# Patient Record
Sex: Female | Born: 1945 | Race: Black or African American | Hispanic: No | Marital: Married | Smoking: Never smoker
Health system: Southern US, Community
[De-identification: ages and names within clinical notes are randomized; demographics above are authoritative.]

## PROBLEM LIST (undated history)

## (undated) DIAGNOSIS — I10 Essential (primary) hypertension: Secondary | ICD-10-CM

## (undated) HISTORY — PX: NECK SURGERY: SHX720

## (undated) HISTORY — PX: TUBAL LIGATION: SHX77

## (undated) HISTORY — PX: CHOLECYSTECTOMY: SHX55

## (undated) HISTORY — PX: ABDOMINAL HYSTERECTOMY: SHX81

---

## 2006-08-05 ENCOUNTER — Emergency Department (HOSPITAL_COMMUNITY): Admission: EM | Admit: 2006-08-05 | Discharge: 2006-08-05 | Payer: Self-pay | Admitting: Emergency Medicine

## 2010-02-08 ENCOUNTER — Emergency Department (HOSPITAL_COMMUNITY): Admission: EM | Admit: 2010-02-08 | Discharge: 2010-02-08 | Payer: Self-pay | Admitting: Emergency Medicine

## 2010-02-28 ENCOUNTER — Emergency Department (HOSPITAL_COMMUNITY): Admission: EM | Admit: 2010-02-28 | Discharge: 2010-02-28 | Payer: Self-pay | Admitting: Emergency Medicine

## 2010-09-08 LAB — COMPREHENSIVE METABOLIC PANEL
ALT: 19 U/L (ref 0–35)
AST: 24 U/L (ref 0–37)
Chloride: 107 mEq/L (ref 96–112)
Sodium: 138 mEq/L (ref 135–145)
Total Protein: 7.8 g/dL (ref 6.0–8.3)

## 2010-09-08 LAB — URINALYSIS, ROUTINE W REFLEX MICROSCOPIC
Bilirubin Urine: NEGATIVE
Glucose, UA: NEGATIVE mg/dL
Hgb urine dipstick: NEGATIVE
Ketones, ur: NEGATIVE mg/dL
Nitrite: NEGATIVE
Specific Gravity, Urine: >1.03 — ABNORMAL HIGH (ref 1.005–1.030)
Urobilinogen, UA: 0.2 mg/dL (ref 0.0–1.0)

## 2010-09-08 LAB — CBC
Hemoglobin: 13.1 g/dL (ref 12.0–15.0)
MCHC: 32.4 g/dL (ref 30.0–36.0)
Platelets: 161 10*3/uL (ref 150–400)
RDW: 15.1 % (ref 11.5–15.5)
WBC: 7.1 10*3/uL (ref 4.0–10.5)

## 2010-09-08 LAB — DIFFERENTIAL
Basophils Absolute: 0 10*3/uL (ref 0.0–0.1)
Basophils Relative: 0 % (ref 0–1)
Eosinophils Relative: 0 % (ref 0–5)
Lymphocytes Relative: 20 % (ref 12–46)
Lymphs Abs: 1.4 10*3/uL (ref 0.7–4.0)
Monocytes Absolute: 0.1 10*3/uL (ref 0.1–1.0)

## 2010-09-09 LAB — CBC
HCT: 41.7 % (ref 36.0–46.0)
MCH: 27.1 pg (ref 26.0–34.0)
RBC: 4.97 MIL/uL (ref 3.87–5.11)
WBC: 8.1 10*3/uL (ref 4.0–10.5)

## 2010-09-09 LAB — HEPATIC FUNCTION PANEL
ALT: 28 U/L (ref 0–35)
AST: 26 U/L (ref 0–37)
Albumin: 4.2 g/dL (ref 3.5–5.2)
Alkaline Phosphatase: 75 U/L (ref 39–117)
Bilirubin, Direct: 0.1 mg/dL (ref 0.0–0.3)
Total Bilirubin: 0.5 mg/dL (ref 0.3–1.2)

## 2010-09-09 LAB — DIFFERENTIAL
Basophils Absolute: 0 10*3/uL (ref 0.0–0.1)
Basophils Relative: 0 % (ref 0–1)
Lymphocytes Relative: 11 % — ABNORMAL LOW (ref 12–46)
Lymphs Abs: 0.9 10*3/uL (ref 0.7–4.0)
Neutro Abs: 7.1 10*3/uL (ref 1.7–7.7)

## 2010-09-09 LAB — BASIC METABOLIC PANEL
BUN: 11 mg/dL (ref 6–23)
Chloride: 103 mEq/L (ref 96–112)
Glucose, Bld: 142 mg/dL — ABNORMAL HIGH (ref 70–99)
Potassium: 4.1 mEq/L (ref 3.5–5.1)
Sodium: 139 mEq/L (ref 135–145)

## 2011-11-17 ENCOUNTER — Other Ambulatory Visit (HOSPITAL_COMMUNITY): Payer: Self-pay | Admitting: Oncology

## 2012-01-15 ENCOUNTER — Emergency Department (HOSPITAL_COMMUNITY): Payer: No Typology Code available for payment source

## 2012-01-15 ENCOUNTER — Encounter (HOSPITAL_COMMUNITY): Payer: Self-pay | Admitting: Emergency Medicine

## 2012-01-15 ENCOUNTER — Emergency Department (HOSPITAL_COMMUNITY)
Admission: EM | Admit: 2012-01-15 | Discharge: 2012-01-15 | Disposition: A | Discharge status: Home / Self Care | Payer: No Typology Code available for payment source | Attending: Emergency Medicine | Admitting: Emergency Medicine

## 2012-01-15 DIAGNOSIS — M549 Dorsalgia, unspecified: Secondary | ICD-10-CM | POA: Insufficient documentation

## 2012-01-15 DIAGNOSIS — R079 Chest pain, unspecified: Secondary | ICD-10-CM | POA: Insufficient documentation

## 2012-01-15 DIAGNOSIS — S20219A Contusion of unspecified front wall of thorax, initial encounter: Secondary | ICD-10-CM | POA: Insufficient documentation

## 2012-01-15 DIAGNOSIS — R071 Chest pain on breathing: Secondary | ICD-10-CM | POA: Insufficient documentation

## 2012-01-15 LAB — COMPREHENSIVE METABOLIC PANEL
ALT: 14 U/L (ref 0–35)
AST: 15 U/L (ref 0–37)
Chloride: 104 mEq/L (ref 96–112)
Creatinine, Ser: 0.96 mg/dL (ref 0.50–1.10)
GFR calc non Af Amer: 60 mL/min — ABNORMAL LOW (ref >90)
Glucose, Bld: 100 mg/dL — ABNORMAL HIGH (ref 70–99)
Total Bilirubin: 0.3 mg/dL (ref 0.3–1.2)

## 2012-01-15 LAB — CBC WITH DIFFERENTIAL/PLATELET
Basophils Absolute: 0 10*3/uL (ref 0.0–0.1)
Eosinophils Absolute: 0.1 10*3/uL (ref 0.0–0.7)
HCT: 39.9 % (ref 36.0–46.0)
Lymphocytes Relative: 47 % — ABNORMAL HIGH (ref 12–46)
Lymphs Abs: 2.4 10*3/uL (ref 0.7–4.0)
Monocytes Relative: 6 % (ref 3–12)
Neutro Abs: 2.3 10*3/uL (ref 1.7–7.7)
RBC: 4.83 MIL/uL (ref 3.87–5.11)
RDW: 15.2 % (ref 11.5–15.5)
WBC: 5.1 10*3/uL (ref 4.0–10.5)

## 2012-01-15 MED ORDER — HYDROCODONE-ACETAMINOPHEN 5-325 MG PO TABS: 1.0000 | ORAL_TABLET | Freq: Four times a day (QID) | ORAL | Status: AC | PRN

## 2012-01-15 MED ORDER — IOHEXOL 350 MG/ML SOLN
100.0000 mL | Freq: Once | INTRAVENOUS | Status: AC | PRN
  Administered 2012-01-15: 100 mL via INTRAVENOUS

## 2012-01-15 NOTE — ED Notes (Signed)
Patient states she does not need anything at this time. 
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
## 2014-08-20 ENCOUNTER — Emergency Department (HOSPITAL_COMMUNITY): Payer: No Typology Code available for payment source

## 2014-08-20 ENCOUNTER — Emergency Department (HOSPITAL_COMMUNITY)
Admission: EM | Admit: 2014-08-20 | Discharge: 2014-08-20 | Disposition: A | Discharge status: Home / Self Care | Payer: No Typology Code available for payment source | Attending: Emergency Medicine | Admitting: Emergency Medicine

## 2014-08-20 ENCOUNTER — Encounter (HOSPITAL_COMMUNITY): Payer: Self-pay | Admitting: Emergency Medicine

## 2014-08-20 DIAGNOSIS — Z79899 Other long term (current) drug therapy: Secondary | ICD-10-CM | POA: Insufficient documentation

## 2014-08-20 DIAGNOSIS — S79911A Unspecified injury of right hip, initial encounter: Secondary | ICD-10-CM | POA: Insufficient documentation

## 2014-08-20 DIAGNOSIS — S79912A Unspecified injury of left hip, initial encounter: Secondary | ICD-10-CM | POA: Insufficient documentation

## 2014-08-20 DIAGNOSIS — Y9389 Activity, other specified: Secondary | ICD-10-CM | POA: Diagnosis not present

## 2014-08-20 DIAGNOSIS — S4992XA Unspecified injury of left shoulder and upper arm, initial encounter: Secondary | ICD-10-CM | POA: Insufficient documentation

## 2014-08-20 DIAGNOSIS — Y998 Other external cause status: Secondary | ICD-10-CM | POA: Diagnosis not present

## 2014-08-20 DIAGNOSIS — S299XXA Unspecified injury of thorax, initial encounter: Secondary | ICD-10-CM | POA: Diagnosis not present

## 2014-08-20 DIAGNOSIS — Z880 Allergy status to penicillin: Secondary | ICD-10-CM | POA: Insufficient documentation

## 2014-08-20 DIAGNOSIS — S199XXA Unspecified injury of neck, initial encounter: Secondary | ICD-10-CM | POA: Insufficient documentation

## 2014-08-20 DIAGNOSIS — Y92410 Unspecified street and highway as the place of occurrence of the external cause: Secondary | ICD-10-CM | POA: Insufficient documentation

## 2014-08-20 LAB — BASIC METABOLIC PANEL
ANION GAP: 3 — AB (ref 5–15)
BUN: 13 mg/dL (ref 6–23)
CALCIUM: 9.6 mg/dL (ref 8.4–10.5)
CO2: 29 mmol/L (ref 19–32)
CREATININE: 0.69 mg/dL (ref 0.50–1.10)
Chloride: 108 mmol/L (ref 96–112)
GFR calc non Af Amer: 87 mL/min — ABNORMAL LOW (ref >90)
Glucose, Bld: 91 mg/dL (ref 70–99)
Potassium: 4.5 mmol/L (ref 3.5–5.1)
Sodium: 140 mmol/L (ref 135–145)

## 2014-08-20 LAB — CBC WITH DIFFERENTIAL/PLATELET
BASOS PCT: 0 % (ref 0–1)
Basophils Absolute: 0 10*3/uL (ref 0.0–0.1)
EOS ABS: 0.1 10*3/uL (ref 0.0–0.7)
Eosinophils Relative: 1 % (ref 0–5)
HCT: 41.9 % (ref 36.0–46.0)
Hemoglobin: 13.6 g/dL (ref 12.0–15.0)
Lymphocytes Relative: 41 % (ref 12–46)
Lymphs Abs: 2.1 10*3/uL (ref 0.7–4.0)
MCH: 27.5 pg (ref 26.0–34.0)
MCHC: 32.5 g/dL (ref 30.0–36.0)
MCV: 84.6 fL (ref 78.0–100.0)
MONOS PCT: 8 % (ref 3–12)
Monocytes Absolute: 0.4 10*3/uL (ref 0.1–1.0)
NEUTROS PCT: 50 % (ref 43–77)
Neutro Abs: 2.6 10*3/uL (ref 1.7–7.7)
Platelets: 177 10*3/uL (ref 150–400)
RBC: 4.95 MIL/uL (ref 3.87–5.11)
RDW: 15 % (ref 11.5–15.5)
WBC: 5.2 10*3/uL (ref 4.0–10.5)

## 2014-08-20 MED ORDER — SODIUM CHLORIDE 0.9 % IV BOLUS (SEPSIS)
1000.0000 mL | Freq: Once | INTRAVENOUS | Status: AC
  Administered 2014-08-20: 1000 mL via INTRAVENOUS

## 2014-08-20 MED ORDER — IOHEXOL 350 MG/ML SOLN
80.0000 mL | Freq: Once | INTRAVENOUS | Status: AC | PRN
  Administered 2014-08-20: 80 mL via INTRAVENOUS
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
## 2014-08-20 NOTE — ED Notes (Signed)
MD at bedside. 
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
## 2016-09-07 ENCOUNTER — Observation Stay (HOSPITAL_COMMUNITY)
Admission: EM | Admit: 2016-09-07 | Discharge: 2016-09-08 | Disposition: A | Discharge status: Home / Self Care | Payer: Medicare Other | Attending: Family Medicine | Admitting: Family Medicine

## 2016-09-07 ENCOUNTER — Encounter (HOSPITAL_COMMUNITY): Payer: Self-pay | Admitting: *Deleted

## 2016-09-07 ENCOUNTER — Emergency Department (HOSPITAL_COMMUNITY): Payer: Medicare Other

## 2016-09-07 DIAGNOSIS — K047 Periapical abscess without sinus: Secondary | ICD-10-CM | POA: Diagnosis present

## 2016-09-07 DIAGNOSIS — I10 Essential (primary) hypertension: Secondary | ICD-10-CM | POA: Diagnosis not present

## 2016-09-07 DIAGNOSIS — K0889 Other specified disorders of teeth and supporting structures: Secondary | ICD-10-CM | POA: Diagnosis not present

## 2016-09-07 DIAGNOSIS — L03211 Cellulitis of face: Secondary | ICD-10-CM | POA: Diagnosis not present

## 2016-09-07 DIAGNOSIS — R220 Localized swelling, mass and lump, head: Secondary | ICD-10-CM | POA: Diagnosis not present

## 2016-09-07 DIAGNOSIS — Z79899 Other long term (current) drug therapy: Secondary | ICD-10-CM | POA: Diagnosis not present

## 2016-09-07 LAB — CBC WITH DIFFERENTIAL/PLATELET
BASOS PCT: 0 %
Basophils Absolute: 0 10*3/uL (ref 0.0–0.1)
EOS ABS: 0.1 10*3/uL (ref 0.0–0.7)
Eosinophils Relative: 1 %
HEMATOCRIT: 39.4 % (ref 36.0–46.0)
HEMOGLOBIN: 12.9 g/dL (ref 12.0–15.0)
LYMPHS ABS: 1.9 10*3/uL (ref 0.7–4.0)
Lymphocytes Relative: 27 %
MCH: 27.8 pg (ref 26.0–34.0)
MCHC: 32.7 g/dL (ref 30.0–36.0)
MCV: 84.9 fL (ref 78.0–100.0)
Monocytes Absolute: 0.5 10*3/uL (ref 0.1–1.0)
Monocytes Relative: 8 %
NEUTROS PCT: 64 %
Neutro Abs: 4.4 10*3/uL (ref 1.7–7.7)
Platelets: 190 10*3/uL (ref 150–400)
RBC: 4.64 MIL/uL (ref 3.87–5.11)
RDW: 14.6 % (ref 11.5–15.5)
WBC: 6.9 10*3/uL (ref 4.0–10.5)

## 2016-09-07 LAB — BASIC METABOLIC PANEL
Anion gap: 7 (ref 5–15)
BUN: 7 mg/dL (ref 6–20)
CHLORIDE: 104 mmol/L (ref 101–111)
CO2: 30 mmol/L (ref 22–32)
Calcium: 9.3 mg/dL (ref 8.9–10.3)
Creatinine, Ser: 0.72 mg/dL (ref 0.44–1.00)
GFR calc non Af Amer: >60 mL/min (ref >60)
Glucose, Bld: 93 mg/dL (ref 65–99)
POTASSIUM: 4.4 mmol/L (ref 3.5–5.1)
SODIUM: 141 mmol/L (ref 135–145)

## 2016-09-07 LAB — LACTIC ACID, PLASMA: LACTIC ACID, VENOUS: 1 mmol/L (ref 0.5–1.9)

## 2016-09-07 MED ORDER — HYDROCODONE-ACETAMINOPHEN 5-325 MG PO TABS
1.0000 | ORAL_TABLET | Freq: Four times a day (QID) | ORAL | Status: DC | PRN
  Administered 2016-09-07: 1 via ORAL
  Filled 2016-09-07: qty 1

## 2016-09-07 MED ORDER — ONDANSETRON HCL 4 MG PO TABS: 4.0000 mg | ORAL_TABLET | Freq: Four times a day (QID) | ORAL | Status: DC | PRN

## 2016-09-07 MED ORDER — SODIUM CHLORIDE 0.9% FLUSH
3.0000 mL | Freq: Two times a day (BID) | INTRAVENOUS | Status: DC
  Administered 2016-09-07: 3 mL via INTRAVENOUS

## 2016-09-07 MED ORDER — OXYCODONE-ACETAMINOPHEN 5-325 MG PO TABS
1.0000 | ORAL_TABLET | Freq: Once | ORAL | Status: AC
  Administered 2016-09-07: 1 via ORAL
  Filled 2016-09-07: qty 1

## 2016-09-07 MED ORDER — HYDRALAZINE HCL 20 MG/ML IJ SOLN: 10.0000 mg | INTRAMUSCULAR | Status: DC | PRN

## 2016-09-07 MED ORDER — ACETAMINOPHEN 650 MG RE SUPP: 650.0000 mg | Freq: Four times a day (QID) | RECTAL | Status: DC | PRN

## 2016-09-07 MED ORDER — SODIUM CHLORIDE 0.9% FLUSH: 3.0000 mL | INTRAVENOUS | Status: DC | PRN

## 2016-09-07 MED ORDER — ONDANSETRON HCL 4 MG/2ML IJ SOLN
4.0000 mg | Freq: Once | INTRAMUSCULAR | Status: AC
  Administered 2016-09-07: 4 mg via INTRAVENOUS
  Filled 2016-09-07: qty 2

## 2016-09-07 MED ORDER — ACETAMINOPHEN 325 MG PO TABS
650.0000 mg | ORAL_TABLET | Freq: Four times a day (QID) | ORAL | Status: DC | PRN
  Administered 2016-09-08: 650 mg via ORAL
  Filled 2016-09-07: qty 2

## 2016-09-07 MED ORDER — KETOROLAC TROMETHAMINE 15 MG/ML IJ SOLN: 15.0000 mg | Freq: Four times a day (QID) | INTRAMUSCULAR | Status: DC | PRN

## 2016-09-07 MED ORDER — HEPARIN SODIUM (PORCINE) 5000 UNIT/ML IJ SOLN
5000.0000 [IU] | Freq: Three times a day (TID) | INTRAMUSCULAR | Status: DC
  Filled 2016-09-07: qty 1

## 2016-09-07 MED ORDER — DOXYCYCLINE HYCLATE 100 MG IV SOLR
100.0000 mg | Freq: Two times a day (BID) | INTRAVENOUS | Status: DC
  Administered 2016-09-07 – 2016-09-08 (×3): 100 mg via INTRAVENOUS
  Filled 2016-09-07 (×9): qty 100

## 2016-09-07 MED ORDER — CLINDAMYCIN PHOSPHATE 600 MG/50ML IV SOLN
600.0000 mg | Freq: Once | INTRAVENOUS | Status: AC
  Administered 2016-09-07: 600 mg via INTRAVENOUS
  Filled 2016-09-07: qty 50

## 2016-09-07 MED ORDER — SODIUM CHLORIDE 0.9 % IV SOLN: 250.0000 mL | INTRAVENOUS | Status: DC | PRN

## 2016-09-07 MED ORDER — SODIUM CHLORIDE 0.9 % IV SOLN
INTRAVENOUS | Status: AC
  Administered 2016-09-07: 15:00:00 via INTRAVENOUS

## 2016-09-07 MED ORDER — SENNOSIDES-DOCUSATE SODIUM 8.6-50 MG PO TABS: 1.0000 | ORAL_TABLET | Freq: Every evening | ORAL | Status: DC | PRN

## 2016-09-07 MED ORDER — ONDANSETRON HCL 4 MG/2ML IJ SOLN: 4.0000 mg | Freq: Four times a day (QID) | INTRAMUSCULAR | Status: DC | PRN

## 2016-09-07 MED ORDER — AMLODIPINE BESYLATE 5 MG PO TABS
5.0000 mg | ORAL_TABLET | Freq: Every day | ORAL | Status: DC
  Administered 2016-09-08: 5 mg via ORAL
  Filled 2016-09-07: qty 1

## 2016-09-07 MED ORDER — SACCHAROMYCES BOULARDII 250 MG PO CAPS
250.0000 mg | ORAL_CAPSULE | Freq: Two times a day (BID) | ORAL | Status: DC
  Administered 2016-09-07 – 2016-09-08 (×3): 250 mg via ORAL
  Filled 2016-09-07 (×3): qty 1

## 2016-09-07 MED ORDER — IOPAMIDOL (ISOVUE-300) INJECTION 61%
75.0000 mL | Freq: Once | INTRAVENOUS | Status: AC | PRN
  Administered 2016-09-07: 75 mL via INTRAVENOUS

## 2016-09-07 MED ORDER — TRAZODONE HCL 50 MG PO TABS: 25.0000 mg | ORAL_TABLET | Freq: Every evening | ORAL | Status: DC | PRN

## 2016-09-07 NOTE — ED Provider Notes (Signed)
[facility] DEPT Provider Note   CSN: [id number] Arrival date & time: 09/07/16  0941     History   Chief Complaint Chief Complaint  Patient presents with  . Facial Swelling    left jaw    HPI Teresa Gillespie is a 71 y.o. female.  Patient is a 70 year old female who presents to the emergency department with a complaint of left facial swelling.  Patient reports that for 2-3 weeks she has been having problems with her teeth. She's been seen by a dentist. She was placed on oral clindamycin. She recently finished her first week of this medication. She noted continued swelling of her face, and went back to see the dentist. The dentist again place the patient on a second round of clindamycin. This morning the patient noted swelling not only in the right jaw, but the right chin area and lip. Underneath her chin on, as well as the previous area that was on the left outer jaw area. The patient states she has not had recent high fever. She has not done nauseated or vomiting. She has discomfort when she attempts to eat. She presents to the emergency department because she was concerned about some much swelling about her face and under her chin area.   The history is provided by the patient.    History reviewed. No pertinent past medical history.  There are no active problems to display for this patient.   Past Surgical History:  Procedure Laterality Date  . ABDOMINAL HYSTERECTOMY    . CHOLECYSTECTOMY    . NECK SURGERY    . TUBAL LIGATION      OB History    No data available       Home Medications    Prior to Admission medications   Medication Sig Start Date End Date Taking? Authorizing Provider  cimetidine (TAGAMET) 200 MG tablet Take 200 mg by mouth daily as needed (heartburn).   Yes Historical Provider, MD  clindamycin (CLEOCIN) 300 MG capsule Take 300 mg by mouth 4 (four) times daily.   Yes Historical Provider, MD    Family History No family history on  file.  Social History Social History  Substance Use Topics  . Smoking status: Never Smoker  . Smokeless tobacco: Never Used  . Alcohol use No     Allergies   Penicillins   Review of Systems Review of Systems  HENT: Positive for dental problem and facial swelling. Negative for drooling, trouble swallowing and voice change.   All other systems reviewed and are negative.    Physical Exam Updated Vital Signs BP (!) 156/81   Pulse (!) 52   Temp 98 F (36.7 C) (Oral)   Resp 16   Ht 5' (1.524 m)   Wt 89.8 kg   SpO2 97%   BMI 38.67 kg/m   Physical Exam  Constitutional: She is oriented to person, place, and time. She appears well-developed and well-nourished.  Non-toxic appearance.  HENT:  Head: Normocephalic.  Right Ear: Tympanic membrane and external ear normal.  Left Ear: Tympanic membrane and external ear normal.  There are dental caries noted of the incisors of the lower jaw area. There is some swelling of the gum in this area. Is no visible abscess appreciated. No swelling of the tongue. No swelling of the upper lip. There is swelling of the left face. There is swelling of the left lower lip extending into the chin. There is swelling in the left submental area. The swelling is warm,  but not hot. There no red streaks about the face.  Eyes: EOM and lids are normal. Pupils are equal, round, and reactive to light.  Neck: Normal range of motion. Neck supple. Carotid bruit is not present.  Cardiovascular: Normal rate, regular rhythm, normal heart sounds, intact distal pulses and normal pulses.  Exam reveals no gallop and no friction rub.   Pulmonary/Chest: Breath sounds normal. No stridor. No respiratory distress. She has no wheezes.  Patient speaks in complete sentences without problem. There is symmetrical rise and fall of the chest. There's no laboring of respirations.  Abdominal: Soft. Bowel sounds are normal. There is no tenderness. There is no guarding.  Musculoskeletal:  Normal range of motion.  Lymphadenopathy:       Head (right side): No submandibular adenopathy present.       Head (left side): No submandibular adenopathy present.    She has no cervical adenopathy.  Neurological: She is alert and oriented to person, place, and time. She has normal strength. No cranial nerve deficit or sensory deficit.  Skin: Skin is warm and dry.  No hives, no rash appreciated.  Psychiatric: She has a normal mood and affect. Her speech is normal.  Nursing note and vitals reviewed.    ED Treatments / Results  Labs (all labs ordered are listed, but only abnormal results are displayed) Labs Reviewed  CBC WITH DIFFERENTIAL/PLATELET  BASIC METABOLIC PANEL    EKG  EKG Interpretation None       Radiology Ct Maxillofacial W Contrast  Result Date: 09/07/2016 CLINICAL DATA:  71 year old female with left facial swelling for 2-3 weeks. Recently completed course of antibiotics. Reported abscess tooth. EXAM: CT MAXILLOFACIAL WITH CONTRAST TECHNIQUE: Multidetector CT imaging of the maxillofacial structures was performed. Multiplanar CT image reconstructions were also generated. A small metallic BB was placed on the right temple in order to reliably differentiate right from left. CONTRAST:  75 cc intravenous Isovue-300 COMPARISON:  08/20/2014 neck CT FINDINGS: Osseous: A cavity and periapical abscess of tooth #20 is noted with left facial soft tissue swelling. A periapical abscess of tooth #12 is also noted. No fracture or subluxation identified. No suspicious focal bony lesion noted. Orbits: Negative. No traumatic or inflammatory finding. Sinuses: Clear. Soft tissues: Left facial soft tissue swelling noted without soft tissue abscess. Limited intracranial: No significant or unexpected finding. IMPRESSION: Periapical abscesses of teeth #20 and #12 with left facial soft tissue swelling. No evidence of soft tissue abscess. No other abnormalities identified. Electronically Signed   By:  Harmon Pier M.D.   On: 09/07/2016 13:09    Procedures Procedures (including critical care time)  Medications Ordered in ED Medications  iopamidol (ISOVUE-300) 61 % injection 75 mL (75 mLs Intravenous Contrast Given 09/07/16 1247)     Initial Impression / Assessment and Plan / ED Course  I have reviewed the triage vital signs and the nursing notes.  Pertinent labs & imaging results that were available during my care of the patient were reviewed by me and considered in my medical decision making (see chart for details).     *I have reviewed nursing notes, vital signs, and all appropriate lab and imaging results for this patient.**  Final Clinical Impressions(s) / ED Diagnoses MDM The blood pressure is slightly elevated, but otherwise vital signs within normal limits. The oxygen pulse ox is 97% on room air. The complete blood count is well within normal limits. The basic metabolic panel is within normal limits. CT scan of the maxillofacial structures  shows a left facial soft tissue swelling without a soft tissue abscess. This periapical abscess of tooth #20 and #12. Patient started on IV clindamycin. Findings discussed with pt and spouse.   Case discussed with Dr. Clarene DukeMcManus. Call placed to [facility].  Patient to be admitted to the hospital.    Final diagnoses:  None    New Prescriptions New Prescriptions   No medications on file     Ivery QualeHobson Sandar Krinke, PA-C 09/07/16 1551    Samuel JesterKathleen McManus, DO 09/10/16 [phone]

## 2016-09-07 NOTE — ED Notes (Signed)
Consulted PA concerning IV antibiotics administration. PA reported to administer abx post blood cultures being obtained.

## 2016-09-07 NOTE — ED Notes (Signed)
Pt back in room at this time.

## 2016-09-07 NOTE — ED Notes (Signed)
Phlebotomist obtained x1 blood culture. Dr. Laural BenesJohnson at bedside assessing patient and reported to phlebotomy that only one set of blood cultures was acceptable due to pt having history of being difficult stick. Phlebotomy reported able to obtain one blood culture and reports no other access found.

## 2016-09-07 NOTE — ED Notes (Signed)
Pt come out to nurses station and reported "the swelling is getting worse."  airway patent. PA aware.   Increased swelling noted to right side of neck.

## 2016-09-07 NOTE — ED Triage Notes (Signed)
Pt c/o left sided facial swelling that started last night. Pt reports when she woke up this morning the swelling had increased tremendously. Pt was taking Clindamycin for tooth cavity/abscess to the front lower side. She finished the antibiotic on Monday and saw her dentist yesterday and was put on a second round of Clindamycin that she started last night. Denies SOB. Lung sounds clear. O2 sat 98% on RA.

## 2016-09-07 NOTE — ED Notes (Signed)
Lab at bedside obtaining cultures.   Hospitalist at bedside.

## 2016-09-07 NOTE — H&P (Signed)
History and Physical  Teresa Gillespie ZOX:[id number] DOB: 1946-04-08 DOA: 09/07/2016  Referring physician: Beverely Pace PCP: Evlyn Courier, MD   Chief Complaint: facial swelling   HPI: Teresa Gillespie is a 71 y.o. female presents to the emergency department with a complaint of left facial swelling. Patient reports that for 2-3 weeks she has been having problems with her teeth. She's been seen by a dentist. She was placed on oral clindamycin. She recently finished her first week of this medication. She noted continued swelling of her face, and went back to see the dentist. The dentist again place the patient on a second round of clindamycin. This morning the patient noted swelling not only in the right jaw, but the right chin area and lip. Underneath her chin on, as well as the previous area that was on the left outer jaw area. The patient states she has not had recent high fever. She has not done nauseated or vomiting. She has discomfort when she attempts to eat. She presents to the emergency department because she was concerned about some much swelling about her face and under her chin area.  Review of Systems: All systems reviewed and apart from history of presenting illness, are negative.  History reviewed. No pertinent past medical history. Past Surgical History:  Procedure Laterality Date  . ABDOMINAL HYSTERECTOMY    . CHOLECYSTECTOMY    . NECK SURGERY    . TUBAL LIGATION     Social History:  reports that she has never smoked. She has never used smokeless tobacco. She reports that she does not drink alcohol or use drugs.   Allergies  Allergen Reactions  . Penicillins Hives and Rash    History reviewed. No pertinent family history.   Prior to Admission medications   Medication Sig Start Date End Date Taking? Authorizing Provider  cimetidine (TAGAMET) 200 MG tablet Take 200 mg by mouth daily as needed (heartburn).   Yes Historical Provider, MD  clindamycin (CLEOCIN) 300 MG  capsule Take 300 mg by mouth 4 (four) times daily.   Yes Historical Provider, MD   Physical Exam: Vitals:   09/07/16 1000 09/07/16 1100 09/07/16 1130 09/07/16 1200  BP: 165/76 155/74 (!) 165/79 (!) 156/81  Pulse: 65 (!) 58 62 (!) 52  Resp:    16  Temp:      TempSrc:      SpO2: 97% 97% 98% 97%  Weight:      Height:        Constitutional: She is oriented to person, place, and time. She appears well-developed and well-nourished.  Non-toxic appearance. Cooperative.  HENT: Head: Normocephalic. There are dental caries noted of the incisors of the lower jaw area. There is some swelling of the gum in this area. Is no visible abscess appreciated. There is swelling of the left face. There is swelling of the left lower lip extending into the chin. There is swelling in the left submental area. The swelling is warm, but not hot. There no red streaks about the face.  Eyes: EOM and lids are normal. Pupils are equal, round, and reactive to light.  Neck: Normal range of motion. Neck supple.  Cardiovascular: Normal rate, regular rhythm, normal heart sounds, intact distal pulses and normal pulses.   Pulmonary/Chest: Breath sounds normal. No stridor. No respiratory distress. She has no wheezes. Patient speaks in complete sentences without problem.  Abdominal: Soft. Bowel sounds are normal. There is no tenderness. There is no guarding.  Musculoskeletal: Normal range  of motion.  Lymphadenopathy:      Head (right side): No submandibular adenopathy present.  Head (left side): No submandibular adenopathy present.   She has no cervical adenopathy.  Neurological: She is alert and oriented to person, place, and time. She has normal strength. No cranial nerve deficit or sensory deficit.  Skin: Skin is warm and dry.  Psychiatric: She has a normal mood and affect. Her speech is normal.   Labs on Admission:  Basic Metabolic Panel:  Recent Labs Lab 09/07/16 1200  NA 141  K 4.4  CL 104  CO2 30  GLUCOSE 93  BUN  7  CREATININE 0.72  CALCIUM 9.3   Liver Function Tests: No results for input(s): AST, ALT, ALKPHOS, BILITOT, PROT, ALBUMIN in the last 168 hours. No results for input(s): LIPASE, AMYLASE in the last 168 hours. No results for input(s): AMMONIA in the last 168 hours. CBC:  Recent Labs Lab 09/07/16 1200  WBC 6.9  NEUTROABS 4.4  HGB 12.9  HCT 39.4  MCV 84.9  PLT 190   Cardiac Enzymes: No results for input(s): CKTOTAL, CKMB, CKMBINDEX, TROPONINI in the last 168 hours.  BNP (last 3 results) No results for input(s): PROBNP in the last 8760 hours. CBG: No results for input(s): GLUCAP in the last 168 hours.  Radiological Exams on Admission: Ct Maxillofacial W Contrast  Result Date: 09/07/2016 CLINICAL DATA:  71 year old female with left facial swelling for 2-3 weeks. Recently completed course of antibiotics. Reported abscess tooth. EXAM: CT MAXILLOFACIAL WITH CONTRAST TECHNIQUE: Multidetector CT imaging of the maxillofacial structures was performed. Multiplanar CT image reconstructions were also generated. A small metallic BB was placed on the right temple in order to reliably differentiate right from left. CONTRAST:  75 cc intravenous Isovue-300 COMPARISON:  08/20/2014 neck CT FINDINGS: Osseous: A cavity and periapical abscess of tooth #20 is noted with left facial soft tissue swelling. A periapical abscess of tooth #12 is also noted. No fracture or subluxation identified. No suspicious focal bony lesion noted. Orbits: Negative. No traumatic or inflammatory finding. Sinuses: Clear. Soft tissues: Left facial soft tissue swelling noted without soft tissue abscess. Limited intracranial: No significant or unexpected finding. IMPRESSION: Periapical abscesses of teeth #20 and #12 with left facial soft tissue swelling. No evidence of soft tissue abscess. No other abnormalities identified. Electronically Signed   By: Harmon Pier M.D.   On: 09/07/2016 13:09   Assessment/Plan Active Problems:    Dental abscess   Facial swelling   Pain, dental  Multiple dental abscesses with associated left facial swelling - Pt failed outpatient management with oral clindamycin.  Will place in observation for IV antibiotics.  Check blood cultures. CT facial - no facial or soft tissue abscess seen but several dental abscesses seen.  Will treat with IV antibiotics and hopefully will be able to discharge on oral antibiotics with outpatient dental follow up early next week.  Pt says that she is going to go and see an Transport planner in [place] next week.  Gentle supportive IVF hydration and pain management if needed.  Please see orders.    Elevated blood pressure - Pt says that she was diagnosed with hypertension some time ago but has not been taking the medication in a long time.  Also elevations possibly related to acute stress and pain, will follow and treat if needed.  Start amlodipine 5 mg daily and add hydralazine IV prn SBP>160.    DVT Prophylaxis: heparin Code Status: full   Time spent: 73  mins  Standley Dakinslanford Maryruth Apple, MD [facility] Pager [phone]  If 7PM-7AM, please contact night-coverage [email or web address] Password [email or web address] 09/07/2016, 2:46 PM

## 2016-09-08 DIAGNOSIS — K0889 Other specified disorders of teeth and supporting structures: Secondary | ICD-10-CM | POA: Diagnosis not present

## 2016-09-08 DIAGNOSIS — I10 Essential (primary) hypertension: Secondary | ICD-10-CM | POA: Diagnosis not present

## 2016-09-08 DIAGNOSIS — R220 Localized swelling, mass and lump, head: Secondary | ICD-10-CM | POA: Diagnosis not present

## 2016-09-08 DIAGNOSIS — K047 Periapical abscess without sinus: Secondary | ICD-10-CM | POA: Diagnosis not present

## 2016-09-08 LAB — CBC
HCT: 38.4 % (ref 36.0–46.0)
Hemoglobin: 12.6 g/dL (ref 12.0–15.0)
MCH: 28.1 pg (ref 26.0–34.0)
MCHC: 32.8 g/dL (ref 30.0–36.0)
MCV: 85.5 fL (ref 78.0–100.0)
PLATELETS: 188 10*3/uL (ref 150–400)
RBC: 4.49 MIL/uL (ref 3.87–5.11)
RDW: 14.7 % (ref 11.5–15.5)
WBC: 8 10*3/uL (ref 4.0–10.5)

## 2016-09-08 MED ORDER — HYDROCODONE-ACETAMINOPHEN 5-325 MG PO TABS: 1.0000 | ORAL_TABLET | Freq: Four times a day (QID) | ORAL | 0 refills | Status: DC | PRN

## 2016-09-08 MED ORDER — AMLODIPINE BESYLATE 5 MG PO TABS: 5.0000 mg | ORAL_TABLET | Freq: Every day | ORAL | 0 refills | Status: AC

## 2016-09-08 MED ORDER — SACCHAROMYCES BOULARDII 250 MG PO CAPS: 250.0000 mg | ORAL_CAPSULE | Freq: Two times a day (BID) | ORAL | 0 refills | Status: AC

## 2016-09-08 MED ORDER — DOXYCYCLINE HYCLATE 100 MG PO CAPS: 100.0000 mg | ORAL_CAPSULE | Freq: Two times a day (BID) | ORAL | 0 refills | Status: DC

## 2016-09-08 MED ORDER — DOXYCYCLINE HYCLATE 100 MG IV SOLR
INTRAVENOUS | Status: AC
  Filled 2016-09-08: qty 100

## 2016-09-08 NOTE — Care Management Obs Status (Signed)
MEDICARE OBSERVATION STATUS NOTIFICATION   Patient Details  Name: Teresa Gillespie MRN: [id number] Date of Birth: June 15, 1946   Medicare Observation Status Notification Given:  Yes    Luisana Lutzke, Chrystine OilerSharley Diane, RN 09/08/2016, 11:59 AM

## 2016-09-08 NOTE — Care Management Note (Signed)
Case Management Note  Patient Details  Name: Teresa Gillespie MRN: [id number] Date of Birth: 12-08-1945     Expected Discharge Date:  09/08/16               Expected Discharge Plan:  Home/Self Care  In-House Referral:     Discharge planning Services  CM Consult  Post Acute Care Choice:  NA Choice offered to:  NA  DME Arranged:    DME Agency:     HH Arranged:    HH Agency:     Status of Service:  Completed, signed off  If discussed at [employer] Length of Stay Meetings, dates discussed:    Additional Comments: Patient discharging home today. No CM needs.  Whitfield Dulay, Teresa OilerSharley Diane, RN 09/08/2016, 12:00 PM

## 2016-09-08 NOTE — Discharge Summary (Signed)
Physician Discharge Summary  Teresa Gillespie OZH:[id number] DOB: 24-Apr-1946 DOA: 09/07/2016  PCP: Evlyn Courier, MD  Admit date: 09/07/2016 Discharge date: 09/08/2016  Admitted From: Home  Disposition:  Home   Recommendations for Outpatient Follow-up:  1. Follow up with PCP in 3 days and oral surgeon in 3-5 days  Discharge Condition: STABLE  CODE STATUS: FULL  Diet recommendation: SOFT   Brief/Interim Summary: Chief Complaint: facial swelling   HPI: Teresa Gillespie is a 71 y.o. female presents to the emergency department with a complaint of left facial swelling. Patient reports that for 2-3 weeks she has been having problems with her teeth. She's been seen by a dentist. She was placed on oral clindamycin. She recently finished her first week of this medication. She noted continued swelling of her face, and went back to see the dentist. The dentist again place the patient on a second round of clindamycin. This morning the patient noted swelling not only in the right jaw, but the right chin area and lip. Underneath her chin on, as well as the previous area that was on the left outer jaw area. The patient states she has not had recent high fever. She has not done nauseated or vomiting. She has discomfort when she attempts to eat. She presents to the emergency department because she was concerned about some much swelling about her face and under her chin area.  Assessment/Plan Active Problems:   Dental abscess   Facial swelling   Pain, dental  Multiple dental abscesses with associated left facial swelling - Pt failed outpatient management with oral clindamycin.  Placed in observation for IV antibiotics. Pt was placed on Iv doxycycline and tolerated well.  Facial swelling improved.  Pt had blood cultures done and no growth to date.  CT facial - no facial or soft tissue abscess seen but several dental abscesses seen. Pt will be able to discharge on oral antibiotics with outpatient dental  follow up early next week.  She needs to see an oral Careers adviser.  Pt says that she is going to go and see an Transport planner in [place] next week.  Gentle supportive IVF hydration and pain management if needed.      Elevated blood pressure - Pt says that she was diagnosed with hypertension some time ago but has not been taking the medication in a long time.  Also elevations possibly related to acute stress and pain, will follow and treat if needed.  Started amlodipine 5 mg daily and patient tolerated and will have her see her PCP in next 3 days for recheck and referral to oral surgeon.      DVT Prophylaxis: heparin Code Status: full   Discharge Diagnoses:  Principal Problem:   Facial swelling Active Problems:   Dental abscess   Pain, dental   Essential hypertension  Discharge Instructions  Discharge Instructions    Diet - low sodium heart healthy    Complete by:  As directed    Increase activity slowly    Complete by:  As directed      Allergies as of 09/08/2016      Reactions   Penicillins Hives, Rash      Medication List    STOP taking these medications   clindamycin 300 MG capsule Commonly known as:  CLEOCIN     TAKE these medications   amLODipine 5 MG tablet Commonly known as:  NORVASC Take 1 tablet (5 mg total) by mouth daily. Start taking on:  09/09/2016  cimetidine 200 MG tablet Commonly known as:  TAGAMET Take 200 mg by mouth daily as needed (heartburn).   doxycycline 100 MG capsule Commonly known as:  VIBRAMYCIN Take 1 capsule (100 mg total) by mouth 2 (two) times daily. Start taking on:  09/09/2016   HYDROcodone-acetaminophen 5-325 MG tablet Commonly known as:  NORCO/VICODIN Take 1 tablet by mouth every 6 (six) hours as needed for severe pain.   saccharomyces boulardii 250 MG capsule Commonly known as:  FLORASTOR Take 1 capsule (250 mg total) by mouth 2 (two) times daily.      Follow-up Information    Evlyn CourierGerald K Hill, MD. Schedule an appointment as  soon as possible for a visit in 3 day(s).   Specialty:  Family Medicine Contact information: [address] [place] [address] [phone]        dental surgeon. Schedule an appointment as soon as possible for a visit.   Why:  Make an appointment ASAP for dental abscess treatment         Allergies  Allergen Reactions  . Penicillins Hives and Rash   Procedures/Studies: Ct Maxillofacial W Contrast  Result Date: 09/07/2016 CLINICAL DATA:  71 year old female with left facial swelling for 2-3 weeks. Recently completed course of antibiotics. Reported abscess tooth. EXAM: CT MAXILLOFACIAL WITH CONTRAST TECHNIQUE: Multidetector CT imaging of the maxillofacial structures was performed. Multiplanar CT image reconstructions were also generated. A small metallic BB was placed on the right temple in order to reliably differentiate right from left. CONTRAST:  75 cc intravenous Isovue-300 COMPARISON:  08/20/2014 neck CT FINDINGS: Osseous: A cavity and periapical abscess of tooth #20 is noted with left facial soft tissue swelling. A periapical abscess of tooth #12 is also noted. No fracture or subluxation identified. No suspicious focal bony lesion noted. Orbits: Negative. No traumatic or inflammatory finding. Sinuses: Clear. Soft tissues: Left facial soft tissue swelling noted without soft tissue abscess. Limited intracranial: No significant or unexpected finding. IMPRESSION: Periapical abscesses of teeth #20 and #12 with left facial soft tissue swelling. No evidence of soft tissue abscess. No other abnormalities identified. Electronically Signed   By: Harmon PierJeffrey  Hu M.D.   On: 09/07/2016 13:09    (Echo, Carotid, EGD, Colonoscopy, ERCP)    Subjective: Pt reports that she feels much better now that her face is not swollen and dental pain is getting better.  No fever or chills.    Discharge Exam: Vitals:   09/08/16 0632 09/08/16 0844  BP: 128/72 136/73  Pulse: 93 97  Resp: 20   Temp: 99.7  F (37.6 C)    Vitals:   09/07/16 1554 09/07/16 2219 09/08/16 0632 09/08/16 0844  BP: (!) 150/98 (!) 148/77 128/72 136/73  Pulse: 82 80 93 97  Resp: 17 16 20    Temp: 98.3 F (36.8 C) 98.5 F (36.9 C) 99.7 F (37.6 C)   TempSrc: Oral Oral Oral   SpO2: 94% 95% 93% 95%  Weight: 89.8 kg (198 lb)     Height: 5' (1.524 m)       General: Facial swelling much improved.  Pt is alert, awake, not in acute distress Cardiovascular: RRR, S1/S2 +, no rubs, no gallops Respiratory: CTA bilaterally, no wheezing, no rhonchi Abdominal: Soft, NT, ND, bowel sounds + Extremities: no edema, no cyanosis  The results of significant diagnostics from this hospitalization (including imaging, microbiology, ancillary and laboratory) are listed below for reference.     Microbiology: Recent Results (from the past 240 hour(s))  Culture,  blood (Routine X 2) w Reflex to ID Panel     Status: None (Preliminary result)   Collection Time: 09/07/16  2:54 PM  Result Value Ref Range Status   Specimen Description LEFT ANTECUBITAL  Final   Special Requests BOTTLES DRAWN AEROBIC AND ANAEROBIC 8CC EACH  Final   Culture NO GROWTH < 24 HOURS  Final   Report Status PENDING  Incomplete     Labs: BNP (last 3 results) No results for input(s): BNP in the last 8760 hours. Basic Metabolic Panel:  Recent Labs Lab 09/07/16 1200  NA 141  K 4.4  CL 104  CO2 30  GLUCOSE 93  BUN 7  CREATININE 0.72  CALCIUM 9.3   Liver Function Tests: No results for input(s): AST, ALT, ALKPHOS, BILITOT, PROT, ALBUMIN in the last 168 hours. No results for input(s): LIPASE, AMYLASE in the last 168 hours. No results for input(s): AMMONIA in the last 168 hours. CBC:  Recent Labs Lab 09/07/16 1200 09/08/16 0535  WBC 6.9 8.0  NEUTROABS 4.4  --   HGB 12.9 12.6  HCT 39.4 38.4  MCV 84.9 85.5  PLT 190 188   Cardiac Enzymes: No results for input(s): CKTOTAL, CKMB, CKMBINDEX, TROPONINI in the last 168 hours. BNP: Invalid input(s):  POCBNP CBG: No results for input(s): GLUCAP in the last 168 hours. D-Dimer No results for input(s): DDIMER in the last 72 hours. Hgb A1c No results for input(s): HGBA1C in the last 72 hours. Lipid Profile No results for input(s): CHOL, HDL, LDLCALC, TRIG, CHOLHDL, LDLDIRECT in the last 72 hours. Thyroid function studies No results for input(s): TSH, T4TOTAL, T3FREE, THYROIDAB in the last 72 hours.  Invalid input(s): FREET3 Anemia work up No results for input(s): VITAMINB12, FOLATE, FERRITIN, TIBC, IRON, RETICCTPCT in the last 72 hours. Urinalysis    Component Value Date/Time   COLORURINE YELLOW 02/28/2010 1254   APPEARANCEUR CLEAR 02/28/2010 1254   LABSPEC >1.030 (H) 02/28/2010 1254   PHURINE 5.0 02/28/2010 1254   GLUCOSEU NEGATIVE 02/28/2010 1254   HGBUR NEGATIVE 02/28/2010 1254   BILIRUBINUR NEGATIVE 02/28/2010 1254   KETONESUR NEGATIVE 02/28/2010 1254   PROTEINUR NEGATIVE 02/28/2010 1254   UROBILINOGEN 0.2 02/28/2010 1254   NITRITE NEGATIVE 02/28/2010 1254   LEUKOCYTESUR  02/28/2010 1254    NEGATIVE MICROSCOPIC NOT DONE ON URINES WITH NEGATIVE PROTEIN, BLOOD, LEUKOCYTES, NITRITE, OR GLUCOSE <1000 mg/dL.   Sepsis Labs Invalid input(s): PROCALCITONIN,  WBC,  LACTICIDVEN Microbiology Recent Results (from the past 240 hour(s))  Culture, blood (Routine X 2) w Reflex to ID Panel     Status: None (Preliminary result)   Collection Time: 09/07/16  2:54 PM  Result Value Ref Range Status   Specimen Description LEFT ANTECUBITAL  Final   Special Requests BOTTLES DRAWN AEROBIC AND ANAEROBIC 8CC EACH  Final   Culture NO GROWTH < 24 HOURS  Final   Report Status PENDING  Incomplete   Time coordinating discharge: Over 30 minutes  SIGNED: Standley Dakins, MD  [facility] 09/08/2016, 11:36 AM Pager   If 7PM-7AM, please contact night-coverage [email or web address] Password [email or web address]

## 2016-09-08 NOTE — Discharge Instructions (Signed)
Dental Abscess A dental abscess is pus in or around a tooth. Follow these instructions at home:  Take medicines only as told by your dentist.  If you were prescribed antibiotic medicine, finish all of it even if you start to feel better.  Rinse your mouth (gargle) often with salt water.  Do not drive or use heavy machinery, like a lawn mower, while taking pain medicine.  Do not apply heat to the outside of your mouth.  Keep all follow-up visits as told by your dentist. This is important. Contact a doctor if:  Your pain is worse, and medicine does not help. Get help right away if:  You have a fever or chills.  Your symptoms suddenly get worse.  You have a very bad headache.  You have problems breathing or swallowing.  You have trouble opening your mouth.  You have puffiness (swelling) in your neck or around your eye. This information is not intended to replace advice given to you by your health care provider. Make sure you discuss any questions you have with your health care provider. Document Released: 10/27/2014 Document Revised: 11/18/2015 Document Reviewed: 06/09/2014 Elsevier Interactive Patient Education  2017 [employer] Inc[employer]   Dental Extraction, Care After These instructions give you information about caring for yourself after your procedure. Your doctor may also give you more specific instructions. Call your doctor if you have any problems or questions after your procedure. Follow these instructions at home: Lifestyle   Protect the area where your tooth was removed. Do this even if you do not have any pain.  Do not smoke, do not spit, and do not drink through a straw until your doctor says it is okay.  Eat soft foods as told by your doctor. Avoid hot drinks and spicy foods until your gum has healed. Cut (Incision) Care   Follow instructions from your doctor about:  How to take care of the cut that was made in your gum.  When and how to change gauze.  When  and how to take gauze out of your mouth.  Having your stitches (sutures) removed.  Do not chew on the gauze.  If your gum is bleeding a lot, fold a clean piece of gauze, place it on the bleeding gum, and bite on it as told by your doctor. General instructions   Take medicines only as told by your doctor.  Do not rinse your mouth until your doctor says it is okay. Once you can rinse your mouth, it is important to rinse very gently.  You may rinse your mouth with warm salt water once your doctor says it is okay. You can make a salt rinse by mixing one teaspoon of salt in two cups of warm water. Do this as told by your doctor.  Do not brush or floss near where your tooth was removed until your doctor says it is okay. You may brush your other teeth.  If directed, apply ice to your cheek on the side of your mouth where your tooth was removed:  Put ice in a plastic bag.  Place a towel between your skin and the bag.  Leave the ice on for 20 minutes, 2-3 times a day.  Keep all follow-up visits as told by your doctor. This is important. Contact a doctor if:  Your medicine is not helping your pain.  You have a fever and you also have any of these:  A sick feeling in your stomach (nausea).  Throwing up (vomiting).  Chills.  You have a very bad cough.  You are short of breath. Get help right away if:  You have a lot of bleeding that does not stop.  You have more puffiness (swelling).  You have very bad pain.  You have fluid, blood, or pus coming from the gum where your tooth was removed.  You have trouble swallowing.  You cannot open your mouth. This information is not intended to replace advice given to you by your health care provider. Make sure you discuss any questions you have with your health care provider. Document Released: 11/30/2009 Document Revised: 11/18/2015 Document Reviewed: 06/08/2014 Elsevier Interactive Patient Education  2017 [employer] Inc.   Dental  Pain Dental pain may be caused by many things, including:  Tooth decay (cavities or caries). Cavities expose the nerve of your tooth to air and hot or cold temperatures. This can cause pain or discomfort.  Abscess or infection. A dental abscess is a collection of infected pus from a bacterial infection in the inner part of the tooth (pulp). It usually occurs at the end of the tooths root.  Injury.  An unknown reason (idiopathic). Your pain may be mild or severe. It may only occur when:  You are chewing.  You are exposed to hot or cold temperature.  You are eating or drinking sugary foods or beverages, such as soda or candy. Your pain may also be constant. Follow these instructions at home: Watch your dental pain for any changes. The following actions may help to lessen any discomfort that you are feeling:  Take medicines only as directed by your dentist.  If you were prescribed an antibiotic medicine, finish all of it even if you start to feel better.  Keep all follow-up visits as directed by your dentist. This is important.  Do not apply heat to the outside of your face.  Rinse your mouth or gargle with salt water if directed by your dentist. This helps with pain and swelling.  You can make salt water by adding  tsp of salt to 1 cup of warm water.  Apply ice to the painful area of your face:  Put ice in a plastic bag.  Place a towel between your skin and the bag.  Leave the ice on for 20 minutes, 2-3 times per day.  Avoid foods or drinks that cause you pain, such as:  Very hot or very cold foods or drinks.  Sweet or sugary foods or drinks. Contact a health care provider if:  Your pain is not controlled with medicines.  Your symptoms are worse.  You have new symptoms. Get help right away if:  You are unable to open your mouth.  You are having trouble breathing or swallowing.  You have a fever.  Your face, neck, or jaw is swollen. This information is not  intended to replace advice given to you by your health care provider. Make sure you discuss any questions you have with your health care provider. Document Released: 06/12/2005 Document Revised: 10/21/2015 Document Reviewed: 06/08/2014 Elsevier Interactive Patient Education  2017 [employer] Inc.  Root Canal A root canal is a procedure to repair the inside of a tooth (endodontic treatment). The inside of teeth is made up of soft tissue (pulp). The pulp contains nerves and blood vessels. Pulp extends down into the roots of your tooth. In this procedure, your dentist removes any damaged pulp, then the space is filled and sealed off. A hard, protective cover (crown) will be placed over  the tooth after the root canal has healed. You may need a root canal if the pulp of one of your teeth becomes inflamed or infected. This can happen if you have a deep cavity or a crack in your tooth. This may be caused by a tooth injury or from having many dental procedures done to your tooth. You may also need a root canal if your tooth is painful, tender, and sensitive to heat and cold. Tell a health care provider about:  Any allergies you have.  All medicines you are taking, including vitamins, herbs, eye drops, creams, and over-the-counter medicines.  Any problems you or family members have had with anesthetic medicines.  Any blood disorders you have.  Any surgeries you have had.  Any medical conditions you have. What are the risks? Generally, this is a safe procedure. However, problems may occur, including:  Pain.  Infection in your tooth root (abscess).  Needing to have another root canal procedure.  Losing your tooth even after having a root canal. What happens before the procedure?  Dental X-rays will be taken to plan your procedure.  Ask your health care provider about:  Changing or stopping your regular medicines. This is especially important if you are taking diabetes medicines or blood  thinners.  Taking medicines such as aspirin and ibuprofen. These medicines can thin your blood. Do not take these medicines before your procedure if your health care provider instructs you not to. What happens during the procedure?  You will be given a medicine that numbs the area (local anesthetic). This medicine will be injected into the gum near your tooth.  You will have a protective shield placed over your tooth (dental dam). The dam is a barrier to protect your tooth from bacteria and debris in your mouth.  Your dentist will use adental drill to make a small opening in your tooth and allow access to the pulp in your tooth.  All of the diseased and dead pulp in your tooth will be removed with a small tool (file).  The dentist may put medicine inside your tooth to prevent infection.  The tooth will be filled with a material that can remain in your tooth (biocompatible). The tooth will then be sealed with a filling to prevent bacteria from entering your tooth.  A temporary crown will be placed to seal the access hole. The procedure may vary among health care providers and hospitals. What happens after the procedure?  Do not eat anything until the numbness from your procedure has worn off.  You may be given antibiotic medicines to help prevent infection. This information is not intended to replace advice given to you by your health care provider. Make sure you discuss any questions you have with your health care provider. Document Released: 11/30/2009 Document Revised: 11/18/2015 Document Reviewed: 06/08/2014 Elsevier Interactive Patient Education  2017 [employer]

## 2016-09-08 NOTE — Progress Notes (Signed)
Pt IV removed per NT, pt tolerated well.  Reviewed discharge instructions with pt and family at bedside and answered questions at this time.

## 2016-09-08 NOTE — Progress Notes (Signed)
PT Cancellation Note  Patient Details Name: Teresa Gillespie MRN: [id number] DOB: 1945/08/27   Cancelled Treatment:    Reason Eval/Treat Not Completed: PT screened, no needs identified, will sign off. Pt received from nursing with husband at bedside. Pt stated she was independent with all ADLs and IADLs prior to admission and she was currently independent with all mobility. Pt functioning at baseline and does not need any further PT services. PT signing off.   Jac CanavanBrooke Powell PT, DPT

## 2016-09-12 LAB — CULTURE, BLOOD (ROUTINE X 2): CULTURE: NO GROWTH

## 2016-09-19 DIAGNOSIS — I10 Essential (primary) hypertension: Secondary | ICD-10-CM | POA: Diagnosis not present

## 2016-09-19 DIAGNOSIS — K047 Periapical abscess without sinus: Secondary | ICD-10-CM | POA: Diagnosis not present

## 2016-09-19 DIAGNOSIS — R220 Localized swelling, mass and lump, head: Secondary | ICD-10-CM | POA: Diagnosis not present

## 2016-10-24 ENCOUNTER — Emergency Department (HOSPITAL_COMMUNITY)
Admission: EM | Admit: 2016-10-24 | Discharge: 2016-10-24 | Disposition: A | Discharge status: Home / Self Care | Payer: Medicare Other | Attending: Emergency Medicine | Admitting: Emergency Medicine

## 2016-10-24 ENCOUNTER — Encounter (HOSPITAL_COMMUNITY): Payer: Self-pay | Admitting: *Deleted

## 2016-10-24 DIAGNOSIS — I10 Essential (primary) hypertension: Secondary | ICD-10-CM | POA: Diagnosis not present

## 2016-10-24 MED ORDER — HYDROCHLOROTHIAZIDE 25 MG PO TABS
25.0000 mg | ORAL_TABLET | Freq: Every day | ORAL | 0 refills | Status: DC
Start: 2016-10-24 — End: 2017-09-16

## 2016-10-24 NOTE — ED Triage Notes (Signed)
States her blood pressure was high at home and ecah time she rechecked the reading was higher

## 2016-10-24 NOTE — ED Provider Notes (Signed)
[facility]-EMERGENCY DEPT Provider Note   CSN: [id number] Arrival date & time: 10/24/16  1952  By signing my name below, I, Majel Homer, attest that this documentation has been prepared under the direction and in the presence of Bethann Berkshire, MD . Electronically Signed: Majel Homer, Scribe. 10/24/2016. 10:11 PM.  History   Chief Complaint Chief Complaint  Patient presents with  . Hypertension   The history is provided by the patient. No language interpreter was used.  Hypertension  This is a new problem. The current episode started 6 to 12 hours ago. Pertinent negatives include no chest pain, no abdominal pain and no headaches. She has tried nothing for the symptoms.   HPI Comments: Teresa Gillespie is a 71 y.o. female with PMHx of HTN, who presents to the Emergency Department for an evaluation of elevated blood pressure that began this afternoon. Pt reports she checked her blood pressure this afternoon and it was "high" and states every time she rechecked it, the reading "got higher and higher." She states she recently ran out of her HTN medication and had an appointment scheduled with her PCP today but was unable to keep the appointment due to insurance reasons. She notes associated bilateral hand, leg, and feet swelling and states her PCP recently changed her medication to "one with a lower diuretic in it." She denies any chest pain or other complaints.  History reviewed. No pertinent past medical history.  Patient Active Problem List   Diagnosis Date Noted  . Dental abscess 09/07/2016  . Facial swelling 09/07/2016  . Pain, dental 09/07/2016  . Essential hypertension 09/07/2016    Past Surgical History:  Procedure Laterality Date  . ABDOMINAL HYSTERECTOMY    . CHOLECYSTECTOMY    . NECK SURGERY    . TUBAL LIGATION      OB History    No data available       Home Medications    Prior to Admission medications   Medication Sig Start Date End Date Taking? Authorizing Provider    amLODipine (NORVASC) 5 MG tablet Take 1 tablet (5 mg total) by mouth daily. 09/09/16 10/09/16  Clanford Cyndie Mull, MD  cimetidine (TAGAMET) 200 MG tablet Take 200 mg by mouth daily as needed (heartburn).    Historical Provider, MD  doxycycline (VIBRAMYCIN) 100 MG capsule Take 1 capsule (100 mg total) by mouth 2 (two) times daily. 09/09/16   Clanford Cyndie Mull, MD  HYDROcodone-acetaminophen (NORCO/VICODIN) 5-325 MG tablet Take 1 tablet by mouth every 6 (six) hours as needed for severe pain. 09/08/16   Clanford Cyndie Mull, MD  saccharomyces boulardii (FLORASTOR) 250 MG capsule Take 1 capsule (250 mg total) by mouth 2 (two) times daily. 09/08/16   Clanford Cyndie Mull, MD    Family History No family history on file.  Social History Social History  Substance Use Topics  . Smoking status: Never Smoker  . Smokeless tobacco: Never Used  . Alcohol use No     Allergies   Penicillins   Review of Systems Review of Systems  Constitutional: Negative for appetite change and fatigue.  HENT: Negative for congestion, ear discharge and sinus pressure.   Eyes: Negative for discharge.  Respiratory: Negative for cough.   Cardiovascular: Positive for leg swelling. Negative for chest pain.  Gastrointestinal: Negative for abdominal pain and diarrhea.  Genitourinary: Negative for frequency and hematuria.  Musculoskeletal: Negative for back pain.  Skin: Negative for rash.  Neurological: Negative for seizures and headaches.  Psychiatric/Behavioral: Negative for hallucinations.  Physical Exam Updated Vital Signs BP (!) 164/86   Pulse 66   Temp 98.4 F (36.9 C) (Oral)   Resp 16   Ht 5' (1.524 m)   Wt 200 lb (90.7 kg)   SpO2 97%   BMI 39.06 kg/m   Physical Exam  Constitutional: She appears well-developed and well-nourished. No distress.  HENT:  Head: Normocephalic.  Eyes: Conjunctivae are normal.  Neck: Neck supple. No tracheal deviation present.  Cardiovascular: Normal rate.   Pulmonary/Chest:  Effort normal. No stridor. No respiratory distress.  Abdominal: She exhibits no distension.  Musculoskeletal: She exhibits edema.  1+ edema to bilateral legs   Neurological: She is alert.  Skin: Skin is warm and dry. No rash noted.  Psychiatric: She has a normal mood and affect.  Nursing note and vitals reviewed.  ED Treatments / Results  DIAGNOSTIC STUDIES:  Oxygen Saturation is 97% on RA, normal by my interpretation.    COORDINATION OF CARE:  10:09 PM Discussed treatment plan with pt at bedside and pt agreed to plan.  Labs (all labs ordered are listed, but only abnormal results are displayed) Labs Reviewed - No data to display  EKG  EKG Interpretation None       Radiology No results found.  Procedures Procedures (including critical care time)  Medications Ordered in ED Medications - No data to display  Initial Impression / Assessment and Plan / ED Course  I have reviewed the triage vital signs and the nursing notes.  Pertinent labs & imaging results that were available during my care of the patient were reviewed by me and considered in my medical decision making (see chart for details).   pt to follow up  With pcp for  bp  Final Clinical Impressions(s) / ED Diagnoses   Final diagnoses:  None    New Prescriptions New Prescriptions   No medications on file  The chart was scribed for me under my direct supervision.  I personally performed the history, physical, and medical decision making and all procedures in the evaluation of this patient.Bethann Berkshire, MD 10/30/16 1012

## 2016-10-24 NOTE — Discharge Instructions (Signed)
Follow up with your md in 2-3 weeks °

## 2016-12-06 DIAGNOSIS — H25813 Combined forms of age-related cataract, bilateral: Secondary | ICD-10-CM | POA: Diagnosis not present

## 2016-12-06 DIAGNOSIS — H43811 Vitreous degeneration, right eye: Secondary | ICD-10-CM | POA: Diagnosis not present

## 2016-12-06 DIAGNOSIS — H5203 Hypermetropia, bilateral: Secondary | ICD-10-CM | POA: Diagnosis not present

## 2016-12-06 DIAGNOSIS — H524 Presbyopia: Secondary | ICD-10-CM | POA: Diagnosis not present

## 2017-02-21 ENCOUNTER — Other Ambulatory Visit (HOSPITAL_COMMUNITY)
Admission: RE | Admit: 2017-02-21 | Discharge: 2017-02-21 | Disposition: A | Discharge status: Home / Self Care | Payer: Medicare Other | Source: Ambulatory Visit | Attending: Pulmonary Disease | Admitting: Pulmonary Disease

## 2017-02-21 DIAGNOSIS — Z0000 Encounter for general adult medical examination without abnormal findings: Secondary | ICD-10-CM | POA: Diagnosis not present

## 2017-02-21 DIAGNOSIS — I10 Essential (primary) hypertension: Secondary | ICD-10-CM | POA: Diagnosis not present

## 2017-02-21 LAB — COMPREHENSIVE METABOLIC PANEL
ALK PHOS: 68 U/L (ref 38–126)
ALT: 20 U/L (ref 14–54)
AST: 20 U/L (ref 15–41)
Albumin: 4.4 g/dL (ref 3.5–5.0)
Anion gap: 5 (ref 5–15)
BILIRUBIN TOTAL: 0.9 mg/dL (ref 0.3–1.2)
BUN: 13 mg/dL (ref 6–20)
CO2: 32 mmol/L (ref 22–32)
Calcium: 9.9 mg/dL (ref 8.9–10.3)
Chloride: 103 mmol/L (ref 101–111)
Creatinine, Ser: 0.83 mg/dL (ref 0.44–1.00)
GFR calc Af Amer: >60 mL/min (ref >60)
GFR calc non Af Amer: >60 mL/min (ref >60)
Glucose, Bld: 96 mg/dL (ref 65–99)
Potassium: 4.1 mmol/L (ref 3.5–5.1)
Sodium: 140 mmol/L (ref 135–145)
Total Protein: 8 g/dL (ref 6.5–8.1)

## 2017-02-21 LAB — URINALYSIS, ROUTINE W REFLEX MICROSCOPIC
Bilirubin Urine: NEGATIVE
GLUCOSE, UA: NEGATIVE mg/dL
Hgb urine dipstick: NEGATIVE
Ketones, ur: NEGATIVE mg/dL
Nitrite: NEGATIVE
PROTEIN: NEGATIVE mg/dL
Specific Gravity, Urine: 1.025 (ref 1.005–1.030)
pH: 5 (ref 5.0–8.0)

## 2017-02-21 LAB — TSH: TSH: 1.527 u[IU]/mL (ref 0.350–4.500)

## 2017-02-21 LAB — LIPID PANEL
CHOL/HDL RATIO: 3.2 ratio
CHOLESTEROL: 257 mg/dL — AB (ref 0–200)
HDL: 80 mg/dL (ref >40)
LDL CALC: 165 mg/dL — AB (ref 0–99)
Triglycerides: 58 mg/dL (ref <150)
VLDL: 12 mg/dL (ref 0–40)

## 2017-02-21 LAB — CBC WITH DIFFERENTIAL/PLATELET
BASOS ABS: 0 10*3/uL (ref 0.0–0.1)
Basophils Relative: 0 %
Eosinophils Absolute: 0.1 10*3/uL (ref 0.0–0.7)
Eosinophils Relative: 2 %
HEMATOCRIT: 41.8 % (ref 36.0–46.0)
Hemoglobin: 13.5 g/dL (ref 12.0–15.0)
LYMPHS PCT: 45 %
Lymphs Abs: 2.4 10*3/uL (ref 0.7–4.0)
MCH: 27.8 pg (ref 26.0–34.0)
MCHC: 32.3 g/dL (ref 30.0–36.0)
MCV: 86.2 fL (ref 78.0–100.0)
MONO ABS: 0.3 10*3/uL (ref 0.1–1.0)
Monocytes Relative: 6 %
NEUTROS ABS: 2.5 10*3/uL (ref 1.7–7.7)
Neutrophils Relative %: 47 %
Platelets: 173 10*3/uL (ref 150–400)
RBC: 4.85 MIL/uL (ref 3.87–5.11)
RDW: 15.6 % — ABNORMAL HIGH (ref 11.5–15.5)
WBC: 5.4 10*3/uL (ref 4.0–10.5)

## 2017-09-15 ENCOUNTER — Emergency Department (HOSPITAL_COMMUNITY)
Admission: EM | Admit: 2017-09-15 | Discharge: 2017-09-16 | Disposition: A | Discharge status: Home / Self Care | Payer: Medicare Other | Attending: Emergency Medicine | Admitting: Emergency Medicine

## 2017-09-15 ENCOUNTER — Emergency Department (HOSPITAL_COMMUNITY): Payer: Medicare Other

## 2017-09-15 ENCOUNTER — Encounter (HOSPITAL_COMMUNITY): Payer: Self-pay | Admitting: *Deleted

## 2017-09-15 ENCOUNTER — Other Ambulatory Visit: Payer: Self-pay

## 2017-09-15 DIAGNOSIS — I10 Essential (primary) hypertension: Secondary | ICD-10-CM | POA: Diagnosis not present

## 2017-09-15 DIAGNOSIS — S43401A Unspecified sprain of right shoulder joint, initial encounter: Secondary | ICD-10-CM | POA: Diagnosis not present

## 2017-09-15 DIAGNOSIS — M25511 Pain in right shoulder: Secondary | ICD-10-CM | POA: Diagnosis not present

## 2017-09-15 DIAGNOSIS — Z79899 Other long term (current) drug therapy: Secondary | ICD-10-CM | POA: Diagnosis not present

## 2017-09-15 NOTE — ED Provider Notes (Signed)
[facility] EMERGENCY DEPARTMENT Provider Note   CSN: [id number] Arrival date & time: 09/15/17  2304     History   Chief Complaint Chief Complaint  Patient presents with  . Shoulder Pain    HPI Teresa Gillespie is a 72 y.o. female.  The history is provided by the patient.  She has a history of hypertension and comes in with pain in her right shoulder.  She was lying in bed and she rolled over and felt a pop in her right shoulder.  Since then, she has had severe pain in that shoulder.  Pain is rated at 10/10.  Pain is increased with any movement.  She has no prior history of shoulder problems.  She denies numbness or tingling.  History reviewed. No pertinent past medical history.  Patient Active Problem List   Diagnosis Date Noted  . Dental abscess 09/07/2016  . Facial swelling 09/07/2016  . Pain, dental 09/07/2016  . Essential hypertension 09/07/2016    Past Surgical History:  Procedure Laterality Date  . ABDOMINAL HYSTERECTOMY    . CHOLECYSTECTOMY    . NECK SURGERY    . TUBAL LIGATION       OB History   None      Home Medications    Prior to Admission medications   Medication Sig Start Date End Date Taking? Authorizing Provider  amLODipine (NORVASC) 5 MG tablet Take 1 tablet (5 mg total) by mouth daily. 09/09/16 10/24/16  Johnson, Clanford L, MD  cimetidine (TAGAMET) 200 MG tablet Take 200 mg by mouth daily as needed (heartburn).    [provider]  doxycycline (VIBRAMYCIN) 100 MG capsule Take 1 capsule (100 mg total) by mouth 2 (two) times daily. Patient not taking: Reported on 10/24/2016 09/09/16   Cleora FleetJohnson, Clanford L, MD  hydrochlorothiazide (HYDRODIURIL) 25 MG tablet Take 1 tablet (25 mg total) by mouth daily. 10/24/16   Bethann BerkshireZammit, Joseph, MD  HYDROcodone-acetaminophen (NORCO/VICODIN) 5-325 MG tablet Take 1 tablet by mouth every 6 (six) hours as needed for severe pain. 09/08/16   Johnson, Clanford L, MD  saccharomyces boulardii (FLORASTOR) 250 MG capsule  Take 1 capsule (250 mg total) by mouth 2 (two) times daily. 09/08/16   Cleora FleetJohnson, Clanford L, MD    Family History No family history on file.  Social History Social History   Tobacco Use  . Smoking status: Never Smoker  . Smokeless tobacco: Never Used  Substance Use Topics  . Alcohol use: No  . Drug use: No     Allergies   Penicillins   Review of Systems Review of Systems  All other systems reviewed and are negative.    Physical Exam Updated Vital Signs BP (!) 190/90   Pulse 77   Temp 98.6 F (37 C) (Oral)   Resp 20   Ht 5' (1.524 m)   Wt 90.7 kg (200 lb)   SpO2 95%   BMI 39.06 kg/m   Physical Exam  Nursing note and vitals reviewed.  72 year old female, resting comfortably and in no acute distress. Vital signs are significant for elevated blood pressure. Oxygen saturation is 95%, which is normal. Head is normocephalic and atraumatic. PERRLA, EOMI. Oropharynx is clear. Neck is nontender and supple without adenopathy or JVD. Back is nontender and there is no CVA tenderness. Lungs are clear without rales, wheezes, or rhonchi. Chest is nontender. Heart has regular rate and rhythm without murmur. Abdomen is soft, flat, nontender without masses or hepatosplenomegaly and peristalsis is normoactive. Extremities: No  swelling or deformity right shoulder.  There is moderate tenderness to palpation rather diffusely throughout the right shoulder and proximal humerus.  Pain is elicited with passive range of motion, but full passive range of motion is present.  There is no point tenderness.  Distal neurovascular exam is intact with strong pulses, prompt capillary refill, normal sensation and normal motor function.. Skin is warm and dry without rash. Neurologic: Mental status is normal, cranial nerves are intact, there are no motor or sensory deficits.  ED Treatments / Results   Radiology Dg Shoulder Right  Result Date: 09/16/2017 CLINICAL DATA:  Felt a pop in right shoulder  when rolling over in bed tonight. Pain with movement. EXAM: RIGHT SHOULDER - 2+ VIEW COMPARISON:  None. FINDINGS: There is no evidence of fracture or dislocation. Mild glenohumeral and acromioclavicular osteoarthritis. Soft tissues are unremarkable. IMPRESSION: Mild degenerative change without acute osseous abnormality. Electronically Signed   By: Rubye Oaks M.D.   On: 09/16/2017 00:10    Procedures Procedures  Medications Ordered in ED Medications  oxyCODONE-acetaminophen (PERCOCET/ROXICET) 5-325 MG per tablet 1 tablet (has no administration in time range)     Initial Impression / Assessment and Plan / ED Course  I have reviewed the triage vital signs and the nursing notes.  Pertinent imaging results that were available during my care of the patient were reviewed by me and considered in my medical decision making (see chart for details).  Right shoulder pain of uncertain cause.  Exam is unremarkable and x-rays showed no evidence of fracture or dislocation.  She will be treated symptomatically.  She is placed in a sling for comfort and is discharged with prescription for oxycodone-acetaminophen for pain.  Referred to orthopedics for follow-up.  Final Clinical Impressions(s) / ED Diagnoses   Final diagnoses:  Acute pain of right shoulder    ED Discharge Orders        Ordered    oxyCODONE-acetaminophen (PERCOCET) 5-325 MG tablet  Every 4 hours PRN     09/16/17 0015    oxyCODONE-acetaminophen (PERCOCET) 5-325 MG tablet  Every 4 hours PRN     09/16/17 0015       Dione Booze, MD 09/16/17 [phone]

## 2017-09-15 NOTE — ED Triage Notes (Signed)
Pt states that she felt a pop in her right shoulder when she rolled over in bed tonight, pain with palpation and movement of shoulder, cms intact distal.

## 2017-09-16 MED ORDER — OXYCODONE-ACETAMINOPHEN 5-325 MG PO TABS: 1.0000 | ORAL_TABLET | ORAL | 0 refills | Status: AC | PRN

## 2017-09-16 MED ORDER — OXYCODONE-ACETAMINOPHEN 5-325 MG PO TABS
1.0000 | ORAL_TABLET | Freq: Once | ORAL | Status: AC
  Administered 2017-09-16: 1 via ORAL
  Filled 2017-09-16: qty 1

## 2017-09-16 NOTE — Discharge Instructions (Addendum)
Apply ice several times a day. Wear the sling as needed. Take ibuprofen for less severe pain.

## 2017-09-17 MED FILL — Oxycodone w/ Acetaminophen Tab 5-325 MG: ORAL | Qty: 6 | Status: AC

## 2018-04-26 DIAGNOSIS — H25813 Combined forms of age-related cataract, bilateral: Secondary | ICD-10-CM | POA: Diagnosis not present

## 2018-04-26 DIAGNOSIS — H43393 Other vitreous opacities, bilateral: Secondary | ICD-10-CM | POA: Diagnosis not present

## 2018-04-26 DIAGNOSIS — H34831 Tributary (branch) retinal vein occlusion, right eye, with macular edema: Secondary | ICD-10-CM | POA: Diagnosis not present

## 2018-05-01 DIAGNOSIS — H3561 Retinal hemorrhage, right eye: Secondary | ICD-10-CM | POA: Diagnosis not present

## 2018-05-01 DIAGNOSIS — H43812 Vitreous degeneration, left eye: Secondary | ICD-10-CM | POA: Diagnosis not present

## 2018-05-01 DIAGNOSIS — H34831 Tributary (branch) retinal vein occlusion, right eye, with macular edema: Secondary | ICD-10-CM | POA: Diagnosis not present

## 2018-05-14 DIAGNOSIS — H34831 Tributary (branch) retinal vein occlusion, right eye, with macular edema: Secondary | ICD-10-CM | POA: Diagnosis not present

## 2018-06-11 DIAGNOSIS — H3561 Retinal hemorrhage, right eye: Secondary | ICD-10-CM | POA: Diagnosis not present

## 2018-06-11 DIAGNOSIS — H43812 Vitreous degeneration, left eye: Secondary | ICD-10-CM | POA: Diagnosis not present

## 2018-06-11 DIAGNOSIS — H2513 Age-related nuclear cataract, bilateral: Secondary | ICD-10-CM | POA: Diagnosis not present

## 2018-06-11 DIAGNOSIS — H34831 Tributary (branch) retinal vein occlusion, right eye, with macular edema: Secondary | ICD-10-CM | POA: Diagnosis not present

## 2018-07-10 DIAGNOSIS — H43812 Vitreous degeneration, left eye: Secondary | ICD-10-CM | POA: Diagnosis not present

## 2018-07-10 DIAGNOSIS — H3561 Retinal hemorrhage, right eye: Secondary | ICD-10-CM | POA: Diagnosis not present

## 2018-07-10 DIAGNOSIS — H2513 Age-related nuclear cataract, bilateral: Secondary | ICD-10-CM | POA: Diagnosis not present

## 2018-07-10 DIAGNOSIS — H34831 Tributary (branch) retinal vein occlusion, right eye, with macular edema: Secondary | ICD-10-CM | POA: Diagnosis not present

## 2018-08-21 DIAGNOSIS — H3561 Retinal hemorrhage, right eye: Secondary | ICD-10-CM | POA: Diagnosis not present

## 2018-08-21 DIAGNOSIS — H43812 Vitreous degeneration, left eye: Secondary | ICD-10-CM | POA: Diagnosis not present

## 2018-08-21 DIAGNOSIS — H34831 Tributary (branch) retinal vein occlusion, right eye, with macular edema: Secondary | ICD-10-CM | POA: Diagnosis not present

## 2018-08-21 DIAGNOSIS — H2513 Age-related nuclear cataract, bilateral: Secondary | ICD-10-CM | POA: Diagnosis not present

## 2018-10-09 DIAGNOSIS — H34831 Tributary (branch) retinal vein occlusion, right eye, with macular edema: Secondary | ICD-10-CM | POA: Diagnosis not present

## 2018-10-16 DIAGNOSIS — Z0000 Encounter for general adult medical examination without abnormal findings: Secondary | ICD-10-CM | POA: Diagnosis not present

## 2018-11-27 DIAGNOSIS — H34831 Tributary (branch) retinal vein occlusion, right eye, with macular edema: Secondary | ICD-10-CM | POA: Diagnosis not present

## 2019-01-15 DIAGNOSIS — H3561 Retinal hemorrhage, right eye: Secondary | ICD-10-CM | POA: Diagnosis not present

## 2019-01-15 DIAGNOSIS — H2513 Age-related nuclear cataract, bilateral: Secondary | ICD-10-CM | POA: Diagnosis not present

## 2019-01-15 DIAGNOSIS — H43812 Vitreous degeneration, left eye: Secondary | ICD-10-CM | POA: Diagnosis not present

## 2019-01-15 DIAGNOSIS — H34831 Tributary (branch) retinal vein occlusion, right eye, with macular edema: Secondary | ICD-10-CM | POA: Diagnosis not present

## 2019-02-17 DIAGNOSIS — Z0000 Encounter for general adult medical examination without abnormal findings: Secondary | ICD-10-CM | POA: Diagnosis not present

## 2019-02-18 DIAGNOSIS — Z0000 Encounter for general adult medical examination without abnormal findings: Secondary | ICD-10-CM | POA: Diagnosis not present

## 2019-02-18 DIAGNOSIS — I10 Essential (primary) hypertension: Secondary | ICD-10-CM | POA: Diagnosis not present

## 2019-03-05 DIAGNOSIS — H2513 Age-related nuclear cataract, bilateral: Secondary | ICD-10-CM | POA: Diagnosis not present

## 2019-03-05 DIAGNOSIS — H43812 Vitreous degeneration, left eye: Secondary | ICD-10-CM | POA: Diagnosis not present

## 2019-03-05 DIAGNOSIS — H3561 Retinal hemorrhage, right eye: Secondary | ICD-10-CM | POA: Diagnosis not present

## 2019-03-05 DIAGNOSIS — H34831 Tributary (branch) retinal vein occlusion, right eye, with macular edema: Secondary | ICD-10-CM | POA: Diagnosis not present

## 2019-05-09 DIAGNOSIS — H43812 Vitreous degeneration, left eye: Secondary | ICD-10-CM | POA: Diagnosis not present

## 2019-05-09 DIAGNOSIS — H2513 Age-related nuclear cataract, bilateral: Secondary | ICD-10-CM | POA: Diagnosis not present

## 2019-05-09 DIAGNOSIS — H3561 Retinal hemorrhage, right eye: Secondary | ICD-10-CM | POA: Diagnosis not present

## 2019-05-09 DIAGNOSIS — H34831 Tributary (branch) retinal vein occlusion, right eye, with macular edema: Secondary | ICD-10-CM | POA: Diagnosis not present

## 2019-06-10 DIAGNOSIS — K2100 Gastro-esophageal reflux disease with esophagitis, without bleeding: Secondary | ICD-10-CM | POA: Diagnosis not present

## 2019-06-10 DIAGNOSIS — E785 Hyperlipidemia, unspecified: Secondary | ICD-10-CM | POA: Diagnosis not present

## 2019-06-25 ENCOUNTER — Other Ambulatory Visit: Payer: Self-pay

## 2019-06-25 NOTE — Patient Outreach (Signed)
[facility]) Care Management  06/25/2019  Teresa Gillespie 06-10-1946 [id number]   Medication Adherence call to Teresa Gillespie [facility] Identifiers Verify spoke with patient she is past due on Atorvastatin 20 mg,patient explain she takes 1 tablet daily ,patient ask if we can call [facility] to order this medication,pharmacy will have it ready for patient to pick up. Teresa Gillespie is showing past due under [place].   [facility] Management Direct Dial [phone]  Fax [phone] [email or web address]

## 2019-08-12 DIAGNOSIS — H43812 Vitreous degeneration, left eye: Secondary | ICD-10-CM | POA: Diagnosis not present

## 2019-08-12 DIAGNOSIS — H2513 Age-related nuclear cataract, bilateral: Secondary | ICD-10-CM | POA: Diagnosis not present

## 2019-08-12 DIAGNOSIS — H34831 Tributary (branch) retinal vein occlusion, right eye, with macular edema: Secondary | ICD-10-CM | POA: Diagnosis not present

## 2019-08-12 DIAGNOSIS — H3561 Retinal hemorrhage, right eye: Secondary | ICD-10-CM | POA: Diagnosis not present

## 2019-09-29 DIAGNOSIS — Z0189 Encounter for other specified special examinations: Secondary | ICD-10-CM | POA: Diagnosis not present

## 2019-09-29 DIAGNOSIS — E785 Hyperlipidemia, unspecified: Secondary | ICD-10-CM | POA: Diagnosis not present

## 2019-09-29 DIAGNOSIS — K219 Gastro-esophageal reflux disease without esophagitis: Secondary | ICD-10-CM | POA: Diagnosis not present

## 2019-10-07 DIAGNOSIS — L989 Disorder of the skin and subcutaneous tissue, unspecified: Secondary | ICD-10-CM | POA: Diagnosis not present

## 2019-10-14 DIAGNOSIS — L989 Disorder of the skin and subcutaneous tissue, unspecified: Secondary | ICD-10-CM | POA: Diagnosis not present

## 2019-10-20 DIAGNOSIS — Z712 Person consulting for explanation of examination or test findings: Secondary | ICD-10-CM | POA: Diagnosis not present

## 2019-10-20 DIAGNOSIS — Z0000 Encounter for general adult medical examination without abnormal findings: Secondary | ICD-10-CM | POA: Diagnosis not present

## 2019-10-20 DIAGNOSIS — E785 Hyperlipidemia, unspecified: Secondary | ICD-10-CM | POA: Diagnosis not present

## 2019-10-21 DIAGNOSIS — H34831 Tributary (branch) retinal vein occlusion, right eye, with macular edema: Secondary | ICD-10-CM | POA: Diagnosis not present

## 2019-10-21 DIAGNOSIS — H43812 Vitreous degeneration, left eye: Secondary | ICD-10-CM | POA: Diagnosis not present

## 2019-10-21 DIAGNOSIS — H2513 Age-related nuclear cataract, bilateral: Secondary | ICD-10-CM | POA: Diagnosis not present

## 2019-10-22 ENCOUNTER — Ambulatory Visit: Payer: Medicare Other | Admitting: Family Medicine

## 2019-10-23 DIAGNOSIS — E785 Hyperlipidemia, unspecified: Secondary | ICD-10-CM | POA: Diagnosis not present

## 2019-10-23 DIAGNOSIS — L989 Disorder of the skin and subcutaneous tissue, unspecified: Secondary | ICD-10-CM | POA: Diagnosis not present

## 2019-10-23 DIAGNOSIS — R7301 Impaired fasting glucose: Secondary | ICD-10-CM | POA: Diagnosis not present

## 2019-10-23 DIAGNOSIS — K219 Gastro-esophageal reflux disease without esophagitis: Secondary | ICD-10-CM | POA: Diagnosis not present

## 2019-10-23 DIAGNOSIS — E870 Hyperosmolality and hypernatremia: Secondary | ICD-10-CM | POA: Diagnosis not present

## 2019-11-05 ENCOUNTER — Ambulatory Visit: Payer: Medicare Other | Admitting: Family Medicine

## 2019-12-30 DIAGNOSIS — H2513 Age-related nuclear cataract, bilateral: Secondary | ICD-10-CM | POA: Diagnosis not present

## 2019-12-30 DIAGNOSIS — H34831 Tributary (branch) retinal vein occlusion, right eye, with macular edema: Secondary | ICD-10-CM | POA: Diagnosis not present

## 2019-12-30 DIAGNOSIS — H43812 Vitreous degeneration, left eye: Secondary | ICD-10-CM | POA: Diagnosis not present

## 2019-12-30 DIAGNOSIS — H3561 Retinal hemorrhage, right eye: Secondary | ICD-10-CM | POA: Diagnosis not present

## 2020-03-09 DIAGNOSIS — H3561 Retinal hemorrhage, right eye: Secondary | ICD-10-CM | POA: Diagnosis not present

## 2020-03-09 DIAGNOSIS — H2513 Age-related nuclear cataract, bilateral: Secondary | ICD-10-CM | POA: Diagnosis not present

## 2020-03-09 DIAGNOSIS — H34831 Tributary (branch) retinal vein occlusion, right eye, with macular edema: Secondary | ICD-10-CM | POA: Diagnosis not present

## 2020-03-09 DIAGNOSIS — H43812 Vitreous degeneration, left eye: Secondary | ICD-10-CM | POA: Diagnosis not present

## 2020-05-20 ENCOUNTER — Encounter (HOSPITAL_COMMUNITY): Payer: Self-pay | Admitting: Emergency Medicine

## 2020-05-20 ENCOUNTER — Other Ambulatory Visit: Payer: Self-pay

## 2020-05-20 ENCOUNTER — Emergency Department (HOSPITAL_COMMUNITY): Payer: Medicare Other

## 2020-05-20 ENCOUNTER — Inpatient Hospital Stay (HOSPITAL_COMMUNITY): Payer: Medicare Other

## 2020-05-20 ENCOUNTER — Inpatient Hospital Stay (HOSPITAL_COMMUNITY)
Admission: EM | Admit: 2020-05-20 | Discharge: 2020-06-26 | DRG: 177 | Disposition: E | Payer: Medicare Other | Attending: Family Medicine | Admitting: Family Medicine

## 2020-05-20 DIAGNOSIS — U071 COVID-19: Principal | ICD-10-CM | POA: Diagnosis present

## 2020-05-20 DIAGNOSIS — Z880 Allergy status to penicillin: Secondary | ICD-10-CM | POA: Diagnosis not present

## 2020-05-20 DIAGNOSIS — J80 Acute respiratory distress syndrome: Secondary | ICD-10-CM | POA: Diagnosis not present

## 2020-05-20 DIAGNOSIS — J9601 Acute respiratory failure with hypoxia: Secondary | ICD-10-CM | POA: Diagnosis not present

## 2020-05-20 DIAGNOSIS — R0602 Shortness of breath: Secondary | ICD-10-CM | POA: Diagnosis not present

## 2020-05-20 DIAGNOSIS — Z515 Encounter for palliative care: Secondary | ICD-10-CM | POA: Diagnosis not present

## 2020-05-20 DIAGNOSIS — T380X5A Adverse effect of glucocorticoids and synthetic analogues, initial encounter: Secondary | ICD-10-CM | POA: Diagnosis not present

## 2020-05-20 DIAGNOSIS — Z9049 Acquired absence of other specified parts of digestive tract: Secondary | ICD-10-CM | POA: Diagnosis not present

## 2020-05-20 DIAGNOSIS — Z90710 Acquired absence of both cervix and uterus: Secondary | ICD-10-CM

## 2020-05-20 DIAGNOSIS — Z66 Do not resuscitate: Secondary | ICD-10-CM | POA: Diagnosis not present

## 2020-05-20 DIAGNOSIS — E875 Hyperkalemia: Secondary | ICD-10-CM | POA: Diagnosis not present

## 2020-05-20 DIAGNOSIS — R918 Other nonspecific abnormal finding of lung field: Secondary | ICD-10-CM | POA: Diagnosis not present

## 2020-05-20 DIAGNOSIS — I248 Other forms of acute ischemic heart disease: Secondary | ICD-10-CM | POA: Diagnosis not present

## 2020-05-20 DIAGNOSIS — E872 Acidosis: Secondary | ICD-10-CM | POA: Diagnosis present

## 2020-05-20 DIAGNOSIS — Z6836 Body mass index (BMI) 36.0-36.9, adult: Secondary | ICD-10-CM

## 2020-05-20 DIAGNOSIS — E1165 Type 2 diabetes mellitus with hyperglycemia: Secondary | ICD-10-CM | POA: Diagnosis not present

## 2020-05-20 DIAGNOSIS — R040 Epistaxis: Secondary | ICD-10-CM | POA: Diagnosis not present

## 2020-05-20 DIAGNOSIS — W19XXXA Unspecified fall, initial encounter: Secondary | ICD-10-CM | POA: Diagnosis present

## 2020-05-20 DIAGNOSIS — Z794 Long term (current) use of insulin: Secondary | ICD-10-CM

## 2020-05-20 DIAGNOSIS — K760 Fatty (change of) liver, not elsewhere classified: Secondary | ICD-10-CM | POA: Diagnosis present

## 2020-05-20 DIAGNOSIS — J9600 Acute respiratory failure, unspecified whether with hypoxia or hypercapnia: Secondary | ICD-10-CM | POA: Diagnosis not present

## 2020-05-20 DIAGNOSIS — I10 Essential (primary) hypertension: Secondary | ICD-10-CM | POA: Diagnosis not present

## 2020-05-20 DIAGNOSIS — R7401 Elevation of levels of liver transaminase levels: Secondary | ICD-10-CM | POA: Diagnosis present

## 2020-05-20 DIAGNOSIS — J982 Interstitial emphysema: Secondary | ICD-10-CM | POA: Diagnosis present

## 2020-05-20 DIAGNOSIS — D696 Thrombocytopenia, unspecified: Secondary | ICD-10-CM | POA: Diagnosis not present

## 2020-05-20 DIAGNOSIS — J1282 Pneumonia due to coronavirus disease 2019: Secondary | ICD-10-CM | POA: Diagnosis present

## 2020-05-20 DIAGNOSIS — E870 Hyperosmolality and hypernatremia: Secondary | ICD-10-CM | POA: Diagnosis not present

## 2020-05-20 DIAGNOSIS — E861 Hypovolemia: Secondary | ICD-10-CM | POA: Diagnosis not present

## 2020-05-20 DIAGNOSIS — J811 Chronic pulmonary edema: Secondary | ICD-10-CM | POA: Diagnosis present

## 2020-05-20 DIAGNOSIS — E871 Hypo-osmolality and hyponatremia: Secondary | ICD-10-CM | POA: Diagnosis not present

## 2020-05-20 DIAGNOSIS — Z79899 Other long term (current) drug therapy: Secondary | ICD-10-CM

## 2020-05-20 DIAGNOSIS — J9811 Atelectasis: Secondary | ICD-10-CM | POA: Diagnosis not present

## 2020-05-20 DIAGNOSIS — N179 Acute kidney failure, unspecified: Secondary | ICD-10-CM | POA: Diagnosis not present

## 2020-05-20 DIAGNOSIS — I351 Nonrheumatic aortic (valve) insufficiency: Secondary | ICD-10-CM | POA: Diagnosis not present

## 2020-05-20 DIAGNOSIS — I517 Cardiomegaly: Secondary | ICD-10-CM | POA: Diagnosis not present

## 2020-05-20 DIAGNOSIS — J90 Pleural effusion, not elsewhere classified: Secondary | ICD-10-CM | POA: Diagnosis not present

## 2020-05-20 DIAGNOSIS — R7989 Other specified abnormal findings of blood chemistry: Secondary | ICD-10-CM | POA: Diagnosis present

## 2020-05-20 DIAGNOSIS — J9602 Acute respiratory failure with hypercapnia: Secondary | ICD-10-CM | POA: Diagnosis not present

## 2020-05-20 DIAGNOSIS — J439 Emphysema, unspecified: Secondary | ICD-10-CM | POA: Diagnosis not present

## 2020-05-20 DIAGNOSIS — E6609 Other obesity due to excess calories: Secondary | ICD-10-CM | POA: Diagnosis present

## 2020-05-20 DIAGNOSIS — J9690 Respiratory failure, unspecified, unspecified whether with hypoxia or hypercapnia: Secondary | ICD-10-CM | POA: Diagnosis not present

## 2020-05-20 DIAGNOSIS — D72829 Elevated white blood cell count, unspecified: Secondary | ICD-10-CM | POA: Diagnosis not present

## 2020-05-20 DIAGNOSIS — R739 Hyperglycemia, unspecified: Secondary | ICD-10-CM | POA: Diagnosis not present

## 2020-05-20 HISTORY — DX: Essential (primary) hypertension: I10

## 2020-05-20 LAB — COMPREHENSIVE METABOLIC PANEL
ALT: 501 U/L — ABNORMAL HIGH (ref 0–44)
AST: 1074 U/L — ABNORMAL HIGH (ref 15–41)
Albumin: 3.5 g/dL (ref 3.5–5.0)
Alkaline Phosphatase: 70 U/L (ref 38–126)
Anion gap: >20 — ABNORMAL HIGH (ref 5–15)
BUN: 55 mg/dL — ABNORMAL HIGH (ref 8–23)
CO2: 20 mmol/L — ABNORMAL LOW (ref 22–32)
Calcium: 8.8 mg/dL — ABNORMAL LOW (ref 8.9–10.3)
Chloride: 101 mmol/L (ref 98–111)
Creatinine, Ser: 2.79 mg/dL — ABNORMAL HIGH (ref 0.44–1.00)
GFR, Estimated: 17 mL/min — ABNORMAL LOW (ref >60)
Glucose, Bld: 219 mg/dL — ABNORMAL HIGH (ref 70–99)
Potassium: 5.4 mmol/L — ABNORMAL HIGH (ref 3.5–5.1)
Sodium: 143 mmol/L (ref 135–145)
Total Bilirubin: 1.2 mg/dL (ref 0.3–1.2)
Total Protein: 8 g/dL (ref 6.5–8.1)

## 2020-05-20 LAB — BLOOD GAS, ARTERIAL
Acid-base deficit: 2.4 mmol/L — ABNORMAL HIGH (ref 0.0–2.0)
Bicarbonate: 22.2 mmol/L (ref 20.0–28.0)
FIO2: 100
O2 Saturation: 75.9 %
Patient temperature: 37
pCO2 arterial: 35.7 mmHg (ref 32.0–48.0)
pH, Arterial: 7.4 (ref 7.350–7.450)
pO2, Arterial: 48.2 mmHg — ABNORMAL LOW (ref 83.0–108.0)

## 2020-05-20 LAB — CBC WITH DIFFERENTIAL/PLATELET
Abs Immature Granulocytes: 0.23 10*3/uL — ABNORMAL HIGH (ref 0.00–0.07)
Basophils Absolute: 0 10*3/uL (ref 0.0–0.1)
Basophils Relative: 0 %
Eosinophils Absolute: 0 10*3/uL (ref 0.0–0.5)
Eosinophils Relative: 0 %
HCT: 51.5 % — ABNORMAL HIGH (ref 36.0–46.0)
Hemoglobin: 15.4 g/dL — ABNORMAL HIGH (ref 12.0–15.0)
Immature Granulocytes: 2 %
Lymphocytes Relative: 14 %
Lymphs Abs: 1.4 10*3/uL (ref 0.7–4.0)
MCH: 27.1 pg (ref 26.0–34.0)
MCHC: 29.9 g/dL — ABNORMAL LOW (ref 30.0–36.0)
MCV: 90.5 fL (ref 80.0–100.0)
Monocytes Absolute: 0.8 10*3/uL (ref 0.1–1.0)
Monocytes Relative: 8 %
Neutro Abs: 7.3 10*3/uL (ref 1.7–7.7)
Neutrophils Relative %: 76 %
Platelets: 197 10*3/uL (ref 150–400)
RBC: 5.69 MIL/uL — ABNORMAL HIGH (ref 3.87–5.11)
RDW: 16.4 % — ABNORMAL HIGH (ref 11.5–15.5)
WBC: 9.7 10*3/uL (ref 4.0–10.5)
nRBC: 0.4 % — ABNORMAL HIGH (ref 0.0–0.2)

## 2020-05-20 LAB — TROPONIN I (HIGH SENSITIVITY)
Troponin I (High Sensitivity): 592 ng/L (ref <18)
Troponin I (High Sensitivity): 540 ng/L (ref <18)

## 2020-05-20 LAB — RESP PANEL BY RT-PCR (FLU A&B, COVID) ARPGX2
Influenza A by PCR: NEGATIVE
Influenza B by PCR: NEGATIVE
SARS Coronavirus 2 by RT PCR: POSITIVE — AB

## 2020-05-20 LAB — LACTIC ACID, PLASMA
Lactic Acid, Venous: >11 mmol/L (ref 0.5–1.9)
Lactic Acid, Venous: 8.9 mmol/L (ref 0.5–1.9)

## 2020-05-20 LAB — LACTATE DEHYDROGENASE: LDH: 2608 U/L — ABNORMAL HIGH (ref 98–192)

## 2020-05-20 LAB — BRAIN NATRIURETIC PEPTIDE: B Natriuretic Peptide: 231 pg/mL — ABNORMAL HIGH (ref 0.0–100.0)

## 2020-05-20 LAB — MRSA PCR SCREENING: MRSA by PCR: NEGATIVE

## 2020-05-20 LAB — APTT: aPTT: 28 seconds (ref 24–36)

## 2020-05-20 LAB — FIBRINOGEN: Fibrinogen: >800 mg/dL — ABNORMAL HIGH (ref 210–475)

## 2020-05-20 LAB — PROCALCITONIN: Procalcitonin: 1.65 ng/mL

## 2020-05-20 LAB — D-DIMER, QUANTITATIVE: D-Dimer, Quant: 3.85 ug/mL-FEU — ABNORMAL HIGH (ref 0.00–0.50)

## 2020-05-20 LAB — PROTIME-INR
INR: 1.3 — ABNORMAL HIGH (ref 0.8–1.2)
Prothrombin Time: 15.4 seconds — ABNORMAL HIGH (ref 11.4–15.2)

## 2020-05-20 MED ORDER — SODIUM CHLORIDE 0.9 % IV SOLN: INTRAVENOUS | Status: AC

## 2020-05-20 MED ORDER — ALBUTEROL SULFATE HFA 108 (90 BASE) MCG/ACT IN AERS
4.0000 | INHALATION_SPRAY | RESPIRATORY_TRACT | Status: DC | PRN
  Administered 2020-05-20: 4 via RESPIRATORY_TRACT
  Filled 2020-05-20 (×2): qty 6.7

## 2020-05-20 MED ORDER — ORAL CARE MOUTH RINSE
15.0000 mL | Freq: Two times a day (BID) | OROMUCOSAL | Status: DC
  Administered 2020-05-20 – 2020-06-08 (×37): 15 mL via OROMUCOSAL

## 2020-05-20 MED ORDER — METHYLPREDNISOLONE SODIUM SUCC 125 MG IJ SOLR
125.0000 mg | Freq: Once | INTRAMUSCULAR | Status: AC
  Administered 2020-05-20: 125 mg via INTRAVENOUS
  Filled 2020-05-20: qty 2

## 2020-05-20 MED ORDER — LEVOFLOXACIN IN D5W 500 MG/100ML IV SOLN: 500.0000 mg | INTRAVENOUS | Status: DC

## 2020-05-20 MED ORDER — HEPARIN SODIUM (PORCINE) 5000 UNIT/ML IJ SOLN
5000.0000 [IU] | Freq: Three times a day (TID) | INTRAMUSCULAR | Status: DC
  Administered 2020-05-20 – 2020-05-22 (×4): 5000 [IU] via SUBCUTANEOUS
  Filled 2020-05-20 (×4): qty 1

## 2020-05-20 MED ORDER — CHLORHEXIDINE GLUCONATE CLOTH 2 % EX PADS
6.0000 | MEDICATED_PAD | Freq: Every day | CUTANEOUS | Status: DC
  Administered 2020-05-20 – 2020-06-08 (×18): 6 via TOPICAL

## 2020-05-20 MED ORDER — LEVOFLOXACIN IN D5W 750 MG/150ML IV SOLN
750.0000 mg | Freq: Once | INTRAVENOUS | Status: AC
  Administered 2020-05-20: 750 mg via INTRAVENOUS
  Filled 2020-05-20: qty 150

## 2020-05-20 MED ORDER — METHYLPREDNISOLONE SODIUM SUCC 125 MG IJ SOLR
1.0000 mg/kg | Freq: Two times a day (BID) | INTRAMUSCULAR | Status: AC
  Administered 2020-05-20 – 2020-05-23 (×6): 85.625 mg via INTRAVENOUS
  Filled 2020-05-20 (×6): qty 2

## 2020-05-20 MED ORDER — ONDANSETRON HCL 4 MG/2ML IJ SOLN: 4.0000 mg | Freq: Four times a day (QID) | INTRAMUSCULAR | Status: DC | PRN

## 2020-05-20 MED ORDER — ONDANSETRON HCL 4 MG PO TABS: 4.0000 mg | ORAL_TABLET | Freq: Four times a day (QID) | ORAL | Status: DC | PRN

## 2020-05-20 MED ORDER — PREDNISONE 50 MG PO TABS
50.0000 mg | ORAL_TABLET | Freq: Every day | ORAL | Status: DC
  Administered 2020-05-24: 50 mg via ORAL
  Filled 2020-05-20 (×2): qty 1

## 2020-05-20 NOTE — ED Provider Notes (Signed)
[facility] EMERGENCY DEPARTMENT Provider Note   CSN: [id number] Arrival date & time: 05/08/2020  1128     History Chief Complaint  Patient presents with  . Shortness of Breath    Teresa Gillespie is a 74 y.o. female.  Patient states that she has had a cough and mild chills since Thursday.  She recently was at a funeral last week.  Patient does not have her Covid test.  The history is provided by the patient and medical records. No language interpreter was used.  Shortness of Breath Severity:  Severe Onset quality:  Sudden Timing:  Constant Progression:  Worsening Chronicity:  New Context: activity   Relieved by:  Nothing Associated symptoms: no abdominal pain, no chest pain, no cough, no headaches and no rash        Past Medical History:  Diagnosis Date  . Hypertension     Patient Active Problem List   Diagnosis Date Noted  . Acute respiratory failure due to COVID-19 (HCC) 05/16/2020  . Dental abscess 09/07/2016  . Facial swelling 09/07/2016  . Pain, dental 09/07/2016  . Essential hypertension 09/07/2016    Past Surgical History:  Procedure Laterality Date  . ABDOMINAL HYSTERECTOMY    . CHOLECYSTECTOMY    . NECK SURGERY    . TUBAL LIGATION       OB History   No obstetric history on file.     History reviewed. No pertinent family history.  Social History   Tobacco Use  . Smoking status: Never Smoker  . Smokeless tobacco: Never Used  Vaping Use  . Vaping Use: Never used  Substance Use Topics  . Alcohol use: No  . Drug use: No    Home Medications Prior to Admission medications   Medication Sig Start Date End Date Taking? Authorizing Provider  amLODipine (NORVASC) 5 MG tablet Take 1 tablet (5 mg total) by mouth daily. 09/09/16 10/24/16  Johnson, Clanford L, MD  oxyCODONE-acetaminophen (PERCOCET) 5-325 MG tablet Take 1 tablet by mouth every 4 (four) hours as needed for moderate pain. Patient not taking: Reported on 05/11/2020 09/16/17   Dione Booze, MD  oxyCODONE-acetaminophen (PERCOCET) 5-325 MG tablet Take 1 tablet by mouth every 4 (four) hours as needed for moderate pain. Patient not taking: Reported on 05/01/2020 09/16/17   Dione Booze, MD  saccharomyces boulardii (FLORASTOR) 250 MG capsule Take 1 capsule (250 mg total) by mouth 2 (two) times daily. Patient not taking: Reported on 04/28/2020 09/08/16   Cleora Fleet, MD    Allergies    Penicillins  Review of Systems   Review of Systems  Constitutional: Negative for appetite change and fatigue.  HENT: Negative for congestion, ear discharge and sinus pressure.   Eyes: Negative for discharge.  Respiratory: Positive for shortness of breath. Negative for cough.   Cardiovascular: Negative for chest pain.  Gastrointestinal: Negative for abdominal pain and diarrhea.  Genitourinary: Negative for frequency and hematuria.  Musculoskeletal: Negative for back pain.  Skin: Negative for rash.  Neurological: Negative for seizures and headaches.  Psychiatric/Behavioral: Negative for hallucinations.    Physical Exam Updated Vital Signs BP 109/68   Pulse 97   Temp 98.6 F (37 C) (Axillary)   Resp (!) 25   SpO2 (!) 83%   Physical Exam Vitals and nursing note reviewed.  Constitutional:      Appearance: She is well-developed.  HENT:     Head: Normocephalic.     Nose: Nose normal.  Eyes:     General:  No scleral icterus.    Conjunctiva/sclera: Conjunctivae normal.  Neck:     Thyroid: No thyromegaly.  Cardiovascular:     Rate and Rhythm: Normal rate and regular rhythm.     Heart sounds: No murmur heard.  No friction rub. No gallop.   Pulmonary:     Breath sounds: No stridor. No wheezing or rales.     Comments: Tachypneic Chest:     Chest wall: No tenderness.  Abdominal:     General: There is no distension.     Tenderness: There is no abdominal tenderness. There is no rebound.  Musculoskeletal:        General: Normal range of motion.     Cervical back: Neck  supple.  Lymphadenopathy:     Cervical: No cervical adenopathy.  Skin:    Findings: No erythema or rash.  Neurological:     Mental Status: She is alert and oriented to person, place, and time.     Motor: No abnormal muscle tone.     Coordination: Coordination normal.  Psychiatric:        Behavior: Behavior normal.     ED Results / Procedures / Treatments   Labs (all labs ordered are listed, but only abnormal results are displayed) Labs Reviewed  RESP PANEL BY RT-PCR (FLU A&B, COVID) ARPGX2 - Abnormal; Notable for the following components:      Result Value   SARS Coronavirus 2 by RT PCR POSITIVE (*)    All other components within normal limits  LACTIC ACID, PLASMA - Abnormal; Notable for the following components:   Lactic Acid, Venous >11 (*)    All other components within normal limits  LACTIC ACID, PLASMA - Abnormal; Notable for the following components:   Lactic Acid, Venous 8.9 (*)    All other components within normal limits  CBC WITH DIFFERENTIAL/PLATELET - Abnormal; Notable for the following components:   RBC 5.69 (*)    Hemoglobin 15.4 (*)    HCT 51.5 (*)    MCHC 29.9 (*)    RDW 16.4 (*)    nRBC 0.4 (*)    Abs Immature Granulocytes 0.23 (*)    All other components within normal limits  PROTIME-INR - Abnormal; Notable for the following components:   Prothrombin Time 15.4 (*)    INR 1.3 (*)    All other components within normal limits  D-DIMER, QUANTITATIVE (NOT AT [facility]) - Abnormal; Notable for the following components:   D-Dimer, Quant 3.85 (*)    All other components within normal limits  BRAIN NATRIURETIC PEPTIDE - Abnormal; Notable for the following components:   B Natriuretic Peptide 231.0 (*)    All other components within normal limits  COMPREHENSIVE METABOLIC PANEL - Abnormal; Notable for the following components:   Potassium 5.4 (*)    CO2 20 (*)    Glucose, Bld 219 (*)    BUN 55 (*)    Creatinine, Ser 2.79 (*)    Calcium 8.8 (*)    AST 1,074 (*)     ALT 501 (*)    GFR, Estimated 17 (*)    Anion gap >20 (*)    All other components within normal limits  BLOOD GAS, ARTERIAL - Abnormal; Notable for the following components:   pO2, Arterial 48.2 (*)    Acid-base deficit 2.4 (*)    All other components within normal limits  TROPONIN I (HIGH SENSITIVITY) - Abnormal; Notable for the following components:   Troponin I (High Sensitivity) 592 (*)  All other components within normal limits  TROPONIN I (HIGH SENSITIVITY) - Abnormal; Notable for the following components:   Troponin I (High Sensitivity) 540 (*)    All other components within normal limits  CULTURE, BLOOD (ROUTINE X 2)  CULTURE, BLOOD (ROUTINE X 2)  URINE CULTURE  APTT  URINALYSIS, ROUTINE W REFLEX MICROSCOPIC    EKG EKG Interpretation  Date/Time:  Thursday May 20 2020 11:48:22 EST Ventricular Rate:  116 PR Interval:    QRS Duration: 89 QT Interval:  315 QTC Calculation: 438 R Axis:   112 Text Interpretation: Sinus tachycardia Consider right atrial enlargement Right axis deviation Low voltage, precordial leads Borderline T abnormalities, diffuse leads Confirmed by Bethann Berkshire [phone]) on 05/24/2020 12:49:24 PM   Radiology DG Chest Port 1 View  Result Date: 05/17/2020 CLINICAL DATA:  Body aches since Tuesday, history hypertension EXAM: PORTABLE CHEST 1 VIEW COMPARISON:  Portable exam 1203 hours compared to 08/20/2014 FINDINGS: Borderline enlargement of cardiac silhouette. Mediastinal contours normal. Patchy BILATERAL pulmonary infiltrates, favor multifocal pneumonia. Minimal streaky atelectasis LEFT base. No pleural effusion or pneumothorax. Bones demineralized. IMPRESSION: Patchy BILATERAL pulmonary infiltrates question multifocal pneumonia. Electronically Signed   By: Ulyses Southward M.D.   On: 05/08/2020 12:14    Procedures Procedures (including critical care time)  Medications Ordered in ED Medications  albuterol (VENTOLIN HFA) 108 (90 Base) MCG/ACT  inhaler 4 puff (has no administration in time range)  levofloxacin (LEVAQUIN) IVPB 750 mg (0 mg Intravenous Stopped 04/30/2020 1410)  methylPREDNISolone sodium succinate (SOLU-MEDROL) 125 mg/2 mL injection 125 mg (125 mg Intravenous Given 05/14/2020 1236)   CRITICAL CARE Performed by: Bethann Berkshire Total critical care time: 45 minutes Critical care time was exclusive of separately billable procedures and treating other patients. Critical care was necessary to treat or prevent imminent or life-threatening deterioration. Critical care was time spent personally by me on the following activities: development of treatment plan with patient and/or surrogate as well as nursing, discussions with consultants, evaluation of patient's response to treatment, examination of patient, obtaining history from patient or surrogate, ordering and performing treatments and interventions, ordering and review of laboratory studies, ordering and review of radiographic studies, pulse oximetry and re-evaluation of patient's condition.  ED Course  I have reviewed the triage vital signs and the nursing notes.  Pertinent labs & imaging results that were available during my care of the patient were reviewed by me and considered in my medical decision making (see chart for details). I spoke with critical care and they stated it felt like the patient could stay at any [facility] hospital.  Hospitalist will come and evaluate the patient determine which hospital is best for admission   MDM Rules/Calculators/A&P                          Patient with COVID-19 and hypoxic.  She will be admitted by the hospitalist. Final Clinical Impression(s) / ED Diagnoses Final diagnoses:  COVID-19    Rx / DC Orders ED Discharge Orders    None       Bethann Berkshire, MD 04/30/2020 1445

## 2020-05-20 NOTE — Progress Notes (Signed)
Pharmacy Antibiotic Note  Teresa Gillespie is a 74 y.o. female presented to t he ED on 05/10/2020 with SOB.  CXR showed findings with concern for PNA.Marland Kitchen COVID test came back positive. Pharmacy is consulted to dose levaquin for CAP.  Today, 05/09/2020: - scr 2.79 (crcl~ 17) - LA 8.9  Plan: - levaquin 750 mg IV x1 given at 1234, then 500 mg IV q48h - monitor renal function closely ____________________________________  Temp (24hrs), Avg:99 F (37.2 C), Min:98.6 F (37 C), Max:99.3 F (37.4 C)  Recent Labs  Lab 05/18/2020 1208 04/27/2020 1354  WBC 9.7  --   CREATININE  --  2.79*  LATICACIDVEN >11* 8.9*    CrCl cannot be calculated (Unknown ideal weight.).    Allergies  Allergen Reactions  . Penicillins Hives and Rash     Thank you for allowing pharmacy to be a part of this patient's care.  Lucia Gaskins 05/14/2020 6:05 PM

## 2020-05-20 NOTE — ED Notes (Signed)
Report Teresa Gillespie, [address]

## 2020-05-20 NOTE — ED Notes (Signed)
CRITICAL VALUE ALERT  Critical Value:  COVID positive  Date & Time Notied:  05-31-20 @ 1336  Provider Notified: Dr Estell Harpin  Orders Received/Actions taken: sign up

## 2020-05-20 NOTE — ED Notes (Signed)
Vital not obtain, Resp in attempting ABGs and having difficult.

## 2020-05-20 NOTE — ED Triage Notes (Addendum)
Pt is short of breath  Oxygen saturation of 55 on room air. Pt placed on a non-rebreather and  sats rose to 62. Respiratory paged and Dr. Estell Harpin made aware. Pt is unable to communicate. This nurse called both patient contacts in the chart with no answer.

## 2020-05-20 NOTE — ED Notes (Signed)
CRITICAL VALUE ALERT  Critical Value:  Troponin 592  Date & Time Notied:  05/24/2020 @ 1248  Provider Notified: Dr Estell Harpin  Orders Received/Actions taken: see new order

## 2020-05-20 NOTE — ED Notes (Signed)
CRITICAL VALUE ALERT  Critical Value: Lactic 8.9  Date & Time Notied:  05/27/2020 @ 1419  Provider Notified: Dr Estell Harpin  Orders Received/Actions taken:

## 2020-05-20 NOTE — ED Notes (Signed)
Call to [facility] no answer   Call to [facility]   Directed to call: [phone] to Lubertha Basque   Transferred to [phone] to Denzil Magnuson, RN

## 2020-05-20 NOTE — Progress Notes (Signed)
Patient seen at bedside and vitals/labs reviewed. Patient requiring 60 L of HHFNC with NRB on top. Concern patient will need a higher level of care at this time so will transfer to stepdown/ICU. Patient declines proning at this time. Nurse updated on plan for transfer.  Jacquelin Hawking, MD [facility] 05/19/2020, 5:59 PM

## 2020-05-20 NOTE — Progress Notes (Signed)
A consult was received from an ED physician for levaquin per pharmacy dosing.  The patient's profile has been reviewed for ht/wt/allergies/indication/available labs.   A one time order has been placed for Levaquin 750mg  IV x 1.  Further antibiotics/pharmacy consults should be ordered by admitting physician if indicated.                       Thank you, 2020-06-07  1:40 PM

## 2020-05-20 NOTE — H&P (Addendum)
History and Physical  Teresa Gillespie ZOX:[id number] DOB: [birth year] DOA: 05/08/2020   PCP: Celene Squibb, MD   Patient coming from: Home  Chief Complaint: sob  HPI:  Teresa Gillespie is a 74 y.o. female with medical history of 74 year old female with a remote history of hypertension and no other documented chronic medical problems presenting with 2-day history of shortness of breath, coughing, and fevers.  The patient states that she began feeling bad after going to a funeral on the morning of 05/18/2020.  The patient had been in her usual state of health prior to her symptoms.  She also complained of a nonproductive cough with some myalgias and a sore throat.  She denied any headache, chest pain, hemoptysis, nausea, vomiting, diarrhea, abdominal pain, dysuria, hematuria.  Because of worsening shortness of breath, EMS was activated.  The patient was noted to have oxygen saturation in the 50% range.  She was placed on nonrebreather with oxygen saturations into the 60s. In the emergency department, the patient was afebrile with soft blood pressures of 103/65.  BMP showed a serum creatinine of 2.79 and potassium of 5.4.  AST 1074, ALT 501, alk phosphatase 70, total bilirubin 1.2, albumin 3.5.  WBC 9.7, hemoglobin 15.4, platelets 197,000.  The patient was given a dose of Solu-Medrol and a dose of levofloxacin.  Chest x-ray showed bilateral patchy opacities.  BNP was 231.  ABG showed 7.40/35/48/22 on 100%.  She was placed on a nonrebreather and 15LHFNC with oxygen saturation 88-91%   Assessment/Plan: Acute respiratory failure secondary to COVID-19 pneumonia -Currently NRB+15LHFNC with oxygen saturation 88-91% -Wean oxygen as tolerated -Not a candidate for remdesivir or baricitinib due to elevated LFTs -Continue IV Solu-Medrol 1 mg/kg every 12 hours -Prone positioning as much as tolerated -Vitamin C and zinc -Due to lack of available stepdown/ICU beds, AC has requested the patient be  transferred to [facility] -Check inflammatory markers  Lactic acidosis -Due to hypoxia rather than severe sepsis -I do not feel this is sepsis driven  Hyperkalemia -Anticipate improvement with IV fluids  Acute kidney injury -Baseline creatinine 0.6-0.8 -Presented with serum creatinine 2.79 -Secondary to volume depletion -Continue IV fluids -Renal ultrasound  Transaminasemia -Secondary to COVID-19 infection -Trend LFTs -Abdominal ultrasound -Hepatitis B surface antigen -Hep C antibody  Elevated troponin -No chest pain presently -Secondary to demand ischemia -EKG without concerning ischemic changes -Echocardiogram        Past Medical History:  Diagnosis Date  . Hypertension    Past Surgical History:  Procedure Laterality Date  . ABDOMINAL HYSTERECTOMY    . CHOLECYSTECTOMY    . NECK SURGERY    . TUBAL LIGATION     Social History:  reports that she has never smoked. She has never used smokeless tobacco. She reports that she does not drink alcohol and does not use drugs.   History reviewed. No pertinent family history.   Allergies  Allergen Reactions  . Penicillins Hives and Rash     Prior to Admission medications   Medication Sig Start Date End Date Taking? Authorizing Provider  amLODipine (NORVASC) 5 MG tablet Take 1 tablet (5 mg total) by mouth daily. 09/09/16 10/24/16  Johnson, Clanford L, MD  oxyCODONE-acetaminophen (PERCOCET) 5-325 MG tablet Take 1 tablet by mouth every 4 (four) hours as needed for moderate pain. Patient not taking: Reported on 81/19/1478 2/95/62   Delora Fuel, MD  oxyCODONE-acetaminophen (PERCOCET) 5-325 MG tablet Take 1 tablet by mouth every 4 (four)  hours as needed for moderate pain. Patient not taking: Reported on 54/49/2010 0/71/21   Delora Fuel, MD  saccharomyces boulardii (FLORASTOR) 250 MG capsule Take 1 capsule (250 mg total) by mouth 2 (two) times daily. Patient not taking: Reported on 05/01/2020 09/08/16    Murlean Iba, MD    Review of Systems:  Limited due to patient extremis  Physical Exam: Vitals:   05/13/2020 1409 05/06/2020 1415 05/01/2020 1430 05/21/2020 1445  BP: 114/74 109/68 121/77 (!) 125/103  Pulse: 100 97 98 96  Resp: (!) 23 (!) 25 (!) 30 (!) 36  Temp:      TempSrc:      SpO2: (!) 83% (!) 83% (!) 82% (!) 84%   General:  A&O x 2, NAD, nontoxic, pleasant/cooperative Head/Eye: No conjunctival hemorrhage, no icterus, [place]/AT, No nystagmus ENT:  No icterus,  No thrush, good dentition, no pharyngeal exudate Neck:  No masses, no lymphadenpathy, no bruits CV:  RRR, no rub, no gallop, no S3 Lung:  Bilateral rales L>R; no wheeze Abdomen: soft/NT, +BS, nondistended, no peritoneal signs Ext: No cyanosis, No rashes, No petechiae, No lymphangitis, No edema   Labs on Admission:  Basic Metabolic Panel: Recent Labs  Lab 05/11/2020 1354  NA 143  K 5.4*  CL 101  CO2 20*  GLUCOSE 219*  BUN 55*  CREATININE 2.79*  CALCIUM 8.8*   Liver Function Tests: Recent Labs  Lab 05/10/2020 1354  AST 1,074*  ALT 501*  ALKPHOS 70  BILITOT 1.2  PROT 8.0  ALBUMIN 3.5   No results for input(s): LIPASE, AMYLASE in the last 168 hours. No results for input(s): AMMONIA in the last 168 hours. CBC: Recent Labs  Lab 05/21/2020 1208  WBC 9.7  NEUTROABS 7.3  HGB 15.4*  HCT 51.5*  MCV 90.5  PLT 197   Coagulation Profile: Recent Labs  Lab 05/03/2020 1208  INR 1.3*   Cardiac Enzymes: No results for input(s): CKTOTAL, CKMB, CKMBINDEX, TROPONINI in the last 168 hours. BNP: Invalid input(s): POCBNP CBG: No results for input(s): GLUCAP in the last 168 hours. Urine analysis:    Component Value Date/Time   COLORURINE YELLOW 02/21/2017 1133   APPEARANCEUR HAZY (A) 02/21/2017 1133   LABSPEC 1.025 02/21/2017 1133   PHURINE 5.0 02/21/2017 1133   GLUCOSEU NEGATIVE 02/21/2017 1133   HGBUR NEGATIVE 02/21/2017 1133   BILIRUBINUR NEGATIVE 02/21/2017 1133   KETONESUR NEGATIVE 02/21/2017 1133    PROTEINUR NEGATIVE 02/21/2017 1133   UROBILINOGEN 0.2 02/28/2010 1254   NITRITE NEGATIVE 02/21/2017 1133   LEUKOCYTESUR LARGE (A) 02/21/2017 1133   Sepsis Labs: '@LABRCNTIP' (procalcitonin:4,lacticidven:4) ) Recent Results (from the past 240 hour(s))  Blood Culture (routine x 2)     Status: None (Preliminary result)   Collection Time: 05/25/2020 12:08 PM   Specimen: BLOOD RIGHT ARM  Result Value Ref Range Status   Specimen Description BLOOD RIGHT ARM DRAWN BY RN  Final   Special Requests   Final    BOTTLES DRAWN AEROBIC AND ANAEROBIC Blood Culture adequate volume Performed at [facility], [address], [place], [place] [address]    Culture PENDING  Incomplete   Report Status PENDING  Incomplete  Blood Culture (routine x 2)     Status: None (Preliminary result)   Collection Time: 05/03/2020 12:08 PM   Specimen: BLOOD LEFT ARM  Result Value Ref Range Status   Specimen Description BLOOD LEFT ARM DRAWN BY RN  Final   Special Requests   Final    BOTTLES DRAWN AEROBIC AND  ANAEROBIC Blood Culture results may not be optimal due to an excessive volume of blood received in culture bottles Performed at [facility], [address], [place], [place] [address]    Culture PENDING  Incomplete   Report Status PENDING  Incomplete  Resp Panel by RT-PCR (Flu A&B, Covid) Nasopharyngeal Swab     Status: Abnormal   Collection Time: 05/11/2020 12:20 PM   Specimen: Nasopharyngeal Swab; Nasopharyngeal(NP) swabs in vial transport medium  Result Value Ref Range Status   SARS Coronavirus 2 by RT PCR POSITIVE (A) NEGATIVE Final    Comment: RESULT CALLED TO, READ BACK BY AND VERIFIED WITH: MARTIN D. AT [phone] [id number] BY THOMPSON S. (NOTE) SARS-CoV-2 target nucleic acids are DETECTED.  The SARS-CoV-2 RNA is generally detectable in upper respiratory specimens during the acute phase of infection. Positive results are indicative of the presence of the identified virus, but do not rule out bacterial infection or  co-infection with other pathogens not detected by the test. Clinical correlation with patient history and other diagnostic information is necessary to determine patient infection status. The expected result is Negative.  Fact Sheet for Patients: EntrepreneurPulse.com.au  Fact Sheet for Healthcare Providers: IncredibleEmployment.be  This test is not yet approved or cleared by the [place] FDA and  has been authorized for detection and/or diagnosis of SARS-CoV-2 by FDA under an Emergency Use Authorization (EUA).  This EUA will remain in effect (meaning this test  can be used) for the duration of  the COVID-19 declaration under Section 564(b)(1) of the Act, 21 U.S.C. section 360bbb-3(b)(1), unless the authorization is terminated or revoked sooner.     Influenza A by PCR NEGATIVE NEGATIVE Final   Influenza B by PCR NEGATIVE NEGATIVE Final    Comment: (NOTE) The Xpert Xpress SARS-CoV-2/FLU/RSV plus assay is intended as an aid in the diagnosis of influenza from Nasopharyngeal swab specimens and should not be used as a sole basis for treatment. Nasal washings and aspirates are unacceptable for Xpert Xpress SARS-CoV-2/FLU/RSV testing.  Fact Sheet for Patients: EntrepreneurPulse.com.au  Fact Sheet for Healthcare Providers: IncredibleEmployment.be  This test is not yet approved or cleared by the [place] FDA and has been authorized for detection and/or diagnosis of SARS-CoV-2 by FDA under an Emergency Use Authorization (EUA). This EUA will remain in effect (meaning this test can be used) for the duration of the COVID-19 declaration under Section 564(b)(1) of the Act, 21 U.S.C. section 360bbb-3(b)(1), unless the authorization is terminated or revoked.  Performed at [facility], [address] [place], [place] [address]      Radiological Exams on Admission: DG Chest Port 1 View  Result Date:  05/19/2020 CLINICAL DATA:  Body aches since Tuesday, history hypertension EXAM: PORTABLE CHEST 1 VIEW COMPARISON:  Portable exam 1203 hours compared to 08/20/2014 FINDINGS: Borderline enlargement of cardiac silhouette. Mediastinal contours normal. Patchy BILATERAL pulmonary infiltrates, favor multifocal pneumonia. Minimal streaky atelectasis LEFT base. No pleural effusion or pneumothorax. Bones demineralized. IMPRESSION: Patchy BILATERAL pulmonary infiltrates question multifocal pneumonia. Electronically Signed   By: Lavonia Dana M.D.   On: 04/29/2020 12:14    EKG: Independently reviewed. Sinus, nonspecific STT change    Time spent:70 minutes Code Status:   FULL Family Communication:  Daughter and spouse updated 11/25 Disposition Plan: expect 3-4 day hospitalization Consults called: none DVT Prophylaxis:  Heparin    Orson Eva, DO  [facility] Pager [phone]  If 7PM-7AM, please contact night-coverage [email or web address] Password [email or web address] 04/29/2020, 3:10 PM

## 2020-05-20 NOTE — ED Notes (Signed)
Having difficulty obtaining IV.

## 2020-05-20 NOTE — ED Notes (Signed)
Report from D. Martin, RN.

## 2020-05-20 NOTE — Progress Notes (Signed)
eLink is monitoring this sepsis. Thank you 

## 2020-05-20 NOTE — ED Notes (Signed)
Call to [facility] for report   No answer to call on terminal ring

## 2020-05-20 NOTE — ED Notes (Addendum)
Report to Iris, RN  Attempt to call daughter with update unsuccessful   Attempt to call spouse with update unsuccessful

## 2020-05-20 NOTE — ED Notes (Addendum)
Patient's history is provided by her daughter. Patient attended a funeral on Tuesday. Daughter reports the pt has had chills since Tuesday. Patients daughter reports she does not wear oxygen at home and no one else in the household has been ill.  Patient's daughter reports the pt was communicating but was unable to get her out of bed prior to arrival.

## 2020-05-20 NOTE — Progress Notes (Signed)
Pt will desats into the 70s while asleep. Respiratory was notified. This nurse help Pt  to lay on side. NRB was tightened and H-HFNC was turned up to 65 L

## 2020-05-21 ENCOUNTER — Other Ambulatory Visit: Payer: Self-pay

## 2020-05-21 ENCOUNTER — Inpatient Hospital Stay (HOSPITAL_COMMUNITY): Payer: Medicare Other

## 2020-05-21 DIAGNOSIS — R0602 Shortness of breath: Secondary | ICD-10-CM | POA: Diagnosis not present

## 2020-05-21 DIAGNOSIS — N179 Acute kidney failure, unspecified: Secondary | ICD-10-CM | POA: Diagnosis not present

## 2020-05-21 DIAGNOSIS — E875 Hyperkalemia: Secondary | ICD-10-CM | POA: Diagnosis not present

## 2020-05-21 DIAGNOSIS — U071 COVID-19: Secondary | ICD-10-CM | POA: Diagnosis not present

## 2020-05-21 DIAGNOSIS — R7401 Elevation of levels of liver transaminase levels: Secondary | ICD-10-CM | POA: Diagnosis not present

## 2020-05-21 DIAGNOSIS — I351 Nonrheumatic aortic (valve) insufficiency: Secondary | ICD-10-CM

## 2020-05-21 LAB — COMPREHENSIVE METABOLIC PANEL
ALT: 481 U/L — ABNORMAL HIGH (ref 0–44)
AST: 989 U/L — ABNORMAL HIGH (ref 15–41)
Albumin: 3.4 g/dL — ABNORMAL LOW (ref 3.5–5.0)
Alkaline Phosphatase: 64 U/L (ref 38–126)
Anion gap: 14 (ref 5–15)
BUN: 57 mg/dL — ABNORMAL HIGH (ref 8–23)
CO2: 25 mmol/L (ref 22–32)
Calcium: 8.3 mg/dL — ABNORMAL LOW (ref 8.9–10.3)
Chloride: 106 mmol/L (ref 98–111)
Creatinine, Ser: 1.64 mg/dL — ABNORMAL HIGH (ref 0.44–1.00)
GFR, Estimated: 33 mL/min — ABNORMAL LOW (ref >60)
Glucose, Bld: 170 mg/dL — ABNORMAL HIGH (ref 70–99)
Potassium: 4.1 mmol/L (ref 3.5–5.1)
Sodium: 145 mmol/L (ref 135–145)
Total Bilirubin: 0.9 mg/dL (ref 0.3–1.2)
Total Protein: 7.3 g/dL (ref 6.5–8.1)

## 2020-05-21 LAB — ECHOCARDIOGRAM COMPLETE
Area-P 1/2: 3.85 cm2
Calc EF: 65.2 %
Height: 60 in
P 1/2 time: 544 msec
S' Lateral: 1.8 cm
Single Plane A2C EF: 70.8 %
Single Plane A4C EF: 71.2 %
Weight: 3030 oz

## 2020-05-21 LAB — D-DIMER, QUANTITATIVE: D-Dimer, Quant: 3.24 ug/mL-FEU — ABNORMAL HIGH (ref 0.00–0.50)

## 2020-05-21 LAB — CBC WITH DIFFERENTIAL/PLATELET
Abs Immature Granulocytes: 0.13 10*3/uL — ABNORMAL HIGH (ref 0.00–0.07)
Basophils Absolute: 0 10*3/uL (ref 0.0–0.1)
Basophils Relative: 0 %
Eosinophils Absolute: 0 10*3/uL (ref 0.0–0.5)
Eosinophils Relative: 0 %
HCT: 47.1 % — ABNORMAL HIGH (ref 36.0–46.0)
Hemoglobin: 14.5 g/dL (ref 12.0–15.0)
Immature Granulocytes: 1 %
Lymphocytes Relative: 14 %
Lymphs Abs: 1.5 10*3/uL (ref 0.7–4.0)
MCH: 27.2 pg (ref 26.0–34.0)
MCHC: 30.8 g/dL (ref 30.0–36.0)
MCV: 88.4 fL (ref 80.0–100.0)
Monocytes Absolute: 0.4 10*3/uL (ref 0.1–1.0)
Monocytes Relative: 3 %
Neutro Abs: 8.7 10*3/uL — ABNORMAL HIGH (ref 1.7–7.7)
Neutrophils Relative %: 82 %
Platelets: 177 10*3/uL (ref 150–400)
RBC: 5.33 MIL/uL — ABNORMAL HIGH (ref 3.87–5.11)
RDW: 15.8 % — ABNORMAL HIGH (ref 11.5–15.5)
WBC: 10.7 10*3/uL — ABNORMAL HIGH (ref 4.0–10.5)
nRBC: 0.3 % — ABNORMAL HIGH (ref 0.0–0.2)

## 2020-05-21 LAB — MAGNESIUM: Magnesium: 3.4 mg/dL — ABNORMAL HIGH (ref 1.7–2.4)

## 2020-05-21 LAB — PHOSPHORUS: Phosphorus: 3.1 mg/dL (ref 2.5–4.6)

## 2020-05-21 LAB — FERRITIN: Ferritin: 2593 ng/mL — ABNORMAL HIGH (ref 11–307)

## 2020-05-21 LAB — C-REACTIVE PROTEIN: CRP: 22.4 mg/dL — ABNORMAL HIGH (ref <1.0)

## 2020-05-21 MED ORDER — LIP MEDEX EX OINT
TOPICAL_OINTMENT | CUTANEOUS | Status: DC | PRN
  Filled 2020-05-21: qty 7

## 2020-05-21 MED ORDER — AYR SALINE NASAL NA GEL
1.0000 "application " | Freq: Four times a day (QID) | NASAL | Status: DC
  Administered 2020-05-21 – 2020-06-09 (×54): 1 via NASAL
  Filled 2020-05-21 (×2): qty 14.1

## 2020-05-21 NOTE — Progress Notes (Signed)
PROGRESS NOTE  Teresa Gillespie IRJ:[id number] DOB: [birth year] DOA: 2020-06-13 PCP: Benita Stabile, MD   LOS: 1 day   Brief Narrative / Interim history: 74 year old female with history of essential hypertension but no other significant medical problems, comes into the hospital with about a week long viral illness with myalgias, cough, fevers, as well as progressive shortness of breath.  She apparently started feeling really bad after attending a funeral on 11/23.  She came initially to [facility] when she was found to have sats in the 50s on room air requiring heated high flow oxygen.  Chest x-ray showed bilateral patchy opacities.  She was also found to be in renal failure as well as have acute liver injury  Subjective / 24h Interval events: -She is doing better this morning, appreciates her breathing is well on oxygen and at rest.  Denies any chest pain, no abdominal pain, no nausea or vomiting.  Assessment & Plan:  Principal Problem Acute Hypoxic Respiratory Failure due to Covid-19 Viral Illness -Patient profoundly hypoxic this morning requiring 100% FiO2 with 60 L heated high flow -Maintaining sats in the low 90s at rest but desatted while sleeping -Overall appears comfortable -OK with mild hypoxemia, goal at rest is > 85% SaO2, with movement ideally > 75% -encouraged the patient to sit up in chair in the daytime use I-S and flutter valve for pulmonary toiletry and then prone in bed when at night. -Unfortunately due to LFT elevation cannot use Remdesivir, actemra or baricitinib -Placed on Levaquin due to elevated procalcitonin, continue for 5 days  FiO2 (%):  [100 %] 100 %    COVID-19 Labs  Recent Labs    2020/06/13 1208 Jun 13, 2020 1354 05/21/20 0250  DDIMER 3.85*  --  3.24*  FERRITIN  --   --  2,593*  LDH  --  2,608*  --   CRP  --   --  22.4*    Lab Results  Component Value Date   SARSCOV2NAA POSITIVE (A) 2020-06-13    Active Problems Acute kidney  injury -Likely multifactorial in the setting of poor p.o. intake prior to admission, probable prolonged hypoxia, creatinine 2.79 in the ED currently improved, continue IV fluids.  Encourage p.o. intake. -Renal ultrasound unremarkable  Acute liver injury -Secondary to COVID-19 infection, prolonged hypoxia, liver ultrasound with evidence of steatosis but no other acute findings.  She has a prior cholecystectomy without biliary dilatation. -LFTs improving today, AST 989, ALT 481  Essential hypertension -Monitor  Scheduled Meds: . Chlorhexidine Gluconate Cloth  6 each Topical Daily  . heparin  5,000 Units Subcutaneous Q8H  . mouth rinse  15 mL Mouth Rinse BID  . methylPREDNISolone (SOLU-MEDROL) injection  1 mg/kg Intravenous Q12H   Followed by  . [START ON 05/24/2020] predniSONE  50 mg Oral Daily   Continuous Infusions: . sodium chloride 100 mL/hr at 05/21/20 0528  . [START ON 05/22/2020] levofloxacin (LEVAQUIN) IV     PRN Meds:.albuterol, ondansetron **OR** ondansetron (ZOFRAN) IV  DVT prophylaxis: Lovenox Code Status: Full code Family Communication: Called daughter Alvis Lemmings [phone]   Status is: Inpatient  Remains inpatient appropriate because:Inpatient level of care appropriate due to severity of illness   Dispo: The patient is from: Home              Anticipated d/c is to: Home              Anticipated d/c date is: > 3 days  Patient currently is not medically stable to d/c.   Consultants:  None   Procedures:  None   Microbiology: None   Antibacterials: Levaquin 11/25   Objective: Vitals:   05/21/20 0306 05/21/20 0400 05/21/20 0700 05/21/20 0851  BP:  135/65 (!) 156/75   Pulse: 72 77 80   Resp: (!) 23 (!) 28 19   Temp:  98.8 F (37.1 C)    TempSrc:  Axillary    SpO2: 94% (!) 89% 94% 96%  Weight:      Height:        Intake/Output Summary (Last 24 hours) at 05/21/2020 0915 Last data filed at 05/21/2020 0400 Gross per 24 hour  Intake  1214.17 ml  Output --  Net 1214.17 ml   Filed Weights   05/13/2020 1900  Weight: 85.9 kg    Examination:  Constitutional: No significant distress Eyes: no scleral icterus ENMT: Mucous membranes are moist.  Neck: normal, supple Respiratory: Bibasilar rhonchi, no wheezing, no crackles, increased respiratory effort, tachypneic Cardiovascular: Regular rate and rhythm, no murmurs / rubs / gallops. Abdomen: non distended, no tenderness. Bowel sounds positive.  Musculoskeletal: no clubbing / cyanosis.  Skin: no rashes Neurologic: CN 2-12 grossly intact. Strength 5/5 in all 4.  Psychiatric: Normal judgment and insight. Alert and oriented x 3. Normal mood.    Data Reviewed: I have independently reviewed following labs and imaging studies   CBC: Recent Labs  Lab 05/19/2020 1208 05/21/20 0250  WBC 9.7 10.7*  NEUTROABS 7.3 8.7*  HGB 15.4* 14.5  HCT 51.5* 47.1*  MCV 90.5 88.4  PLT 197 177   Basic Metabolic Panel: Recent Labs  Lab 05/25/2020 1354 05/21/20 0250  NA 143 145  K 5.4* 4.1  CL 101 106  CO2 20* 25  GLUCOSE 219* 170*  BUN 55* 57*  CREATININE 2.79* 1.64*  CALCIUM 8.8* 8.3*  MG  --  3.4*  PHOS  --  3.1   GFR: Estimated Creatinine Clearance: 29.3 mL/min (A) (by C-G formula based on SCr of 1.64 mg/dL (H)). Liver Function Tests: Recent Labs  Lab 05/25/2020 1354 05/21/20 0250  AST 1,074* 989*  ALT 501* 481*  ALKPHOS 70 64  BILITOT 1.2 0.9  PROT 8.0 7.3  ALBUMIN 3.5 3.4*   No results for input(s): LIPASE, AMYLASE in the last 168 hours. No results for input(s): AMMONIA in the last 168 hours. Coagulation Profile: Recent Labs  Lab 04/29/2020 1208  INR 1.3*   Cardiac Enzymes: No results for input(s): CKTOTAL, CKMB, CKMBINDEX, TROPONINI in the last 168 hours. BNP (last 3 results) No results for input(s): PROBNP in the last 8760 hours. HbA1C: No results for input(s): HGBA1C in the last 72 hours. CBG: No results for input(s): GLUCAP in the last 168 hours. Lipid  Profile: No results for input(s): CHOL, HDL, LDLCALC, TRIG, CHOLHDL, LDLDIRECT in the last 72 hours. Thyroid Function Tests: No results for input(s): TSH, T4TOTAL, FREET4, T3FREE, THYROIDAB in the last 72 hours. Anemia Panel: Recent Labs    05/21/20 0250  FERRITIN 2,593*   Urine analysis:    Component Value Date/Time   COLORURINE YELLOW 02/21/2017 1133   APPEARANCEUR HAZY (A) 02/21/2017 1133   LABSPEC 1.025 02/21/2017 1133   PHURINE 5.0 02/21/2017 1133   GLUCOSEU NEGATIVE 02/21/2017 1133   HGBUR NEGATIVE 02/21/2017 1133   BILIRUBINUR NEGATIVE 02/21/2017 1133   KETONESUR NEGATIVE 02/21/2017 1133   PROTEINUR NEGATIVE 02/21/2017 1133   UROBILINOGEN 0.2 02/28/2010 1254   NITRITE NEGATIVE 02/21/2017 1133   LEUKOCYTESUR  LARGE (A) 02/21/2017 1133   Sepsis Labs: Invalid input(s): PROCALCITONIN, LACTICIDVEN  Recent Results (from the past 240 hour(s))  Blood Culture (routine x 2)     Status: None (Preliminary result)   Collection Time: 05/15/2020 12:08 PM   Specimen: BLOOD RIGHT ARM  Result Value Ref Range Status   Specimen Description BLOOD RIGHT ARM DRAWN BY RN  Final   Special Requests   Final    BOTTLES DRAWN AEROBIC AND ANAEROBIC Blood Culture adequate volume   Culture   Final    NO GROWTH < 24 HOURS Performed at [facility], [address], [place], [place] [address]    Report Status PENDING  Incomplete  Blood Culture (routine x 2)     Status: None (Preliminary result)   Collection Time: 04/29/2020 12:08 PM   Specimen: BLOOD LEFT ARM  Result Value Ref Range Status   Specimen Description BLOOD LEFT ARM DRAWN BY RN  Final   Special Requests   Final    BOTTLES DRAWN AEROBIC AND ANAEROBIC Blood Culture results may not be optimal due to an excessive volume of blood received in culture bottles   Culture   Final    NO GROWTH < 24 HOURS Performed at [facility], [address], [place], [place] [address]    Report Status PENDING  Incomplete  Resp Panel by RT-PCR (Flu A&B, Covid)  Nasopharyngeal Swab     Status: Abnormal   Collection Time: 05/08/2020 12:20 PM   Specimen: Nasopharyngeal Swab; Nasopharyngeal(NP) swabs in vial transport medium  Result Value Ref Range Status   SARS Coronavirus 2 by RT PCR POSITIVE (A) NEGATIVE Final    Comment: RESULT CALLED TO, READ BACK BY AND VERIFIED WITH: MARTIN D. AT [id number] BY THOMPSON S. (NOTE) SARS-CoV-2 target nucleic acids are DETECTED.  The SARS-CoV-2 RNA is generally detectable in upper respiratory specimens during the acute phase of infection. Positive results are indicative of the presence of the identified virus, but do not rule out bacterial infection or co-infection with other pathogens not detected by the test. Clinical correlation with patient history and other diagnostic information is necessary to determine patient infection status. The expected result is Negative.  Fact Sheet for Patients: BloggerCourse.com  Fact Sheet for Healthcare Providers: SeriousBroker.it  This test is not yet approved or cleared by the [place] FDA and  has been authorized for detection and/or diagnosis of SARS-CoV-2 by FDA under an Emergency Use Authorization (EUA).  This EUA will remain in effect (meaning this test  can be used) for the duration of  the COVID-19 declaration under Section 564(b)(1) of the Act, 21 U.S.C. section 360bbb-3(b)(1), unless the authorization is terminated or revoked sooner.     Influenza A by PCR NEGATIVE NEGATIVE Final   Influenza B by PCR NEGATIVE NEGATIVE Final    Comment: (NOTE) The Xpert Xpress SARS-CoV-2/FLU/RSV plus assay is intended as an aid in the diagnosis of influenza from Nasopharyngeal swab specimens and should not be used as a sole basis for treatment. Nasal washings and aspirates are unacceptable for Xpert Xpress SARS-CoV-2/FLU/RSV testing.  Fact Sheet for Patients: BloggerCourse.com  Fact Sheet for  Healthcare Providers: SeriousBroker.it  This test is not yet approved or cleared by the [place] FDA and has been authorized for detection and/or diagnosis of SARS-CoV-2 by FDA under an Emergency Use Authorization (EUA). This EUA will remain in effect (meaning this test can be used) for the duration of the COVID-19 declaration under Section 564(b)(1) of the Act, 21  U.S.C. section 360bbb-3(b)(1), unless the authorization is terminated or revoked.  Performed at [facility], [address] [place], [place] [address]   MRSA PCR Screening     Status: None   Collection Time: 05/01/2020  7:12 PM   Specimen: Nasal Mucosa; Nasopharyngeal  Result Value Ref Range Status   MRSA by PCR NEGATIVE NEGATIVE Final    Comment:        The GeneXpert MRSA Assay (FDA approved for NASAL specimens only), is one component of a comprehensive MRSA colonization surveillance program. It is not intended to diagnose MRSA infection nor to guide or monitor treatment for MRSA infections. Performed at [facility], [address] [place], [place] [address]       Radiology Studies: US Abdomen Complete  Result Date: 05/23/2020 CLINICAL DATA:  Initial evaluation for acute kidney injury, elevated transaminases. EXAM: ABDOMEN ULTRASOUND COMPLETE COMPARISON:  Prior CT from 02/28/2010. FINDINGS: Gallbladder: Prior cholecystectomy. Common bile duct: Diameter: 2.6 mm Liver: No focal lesion identified. Diffusely increased echogenicity seen within the hepatic parenchyma. Portal vein is patent on color Doppler imaging with normal direction of blood flow towards the liver. IVC: No abnormality visualized. Pancreas: Visualized portion unremarkable. Spleen: Size and appearance within normal limits. Right Kidney: Length: 12.2 cm. Echogenicity within normal limits. No mass or hydronephrosis visualized. No nephrolithiasis. Left Kidney: Length: 11.2 cm. Echogenicity within normal  limits. No mass or hydronephrosis visualized. No nephrolithiasis. Abdominal aorta: No aneurysm visualized. Other findings: None. IMPRESSION: 1. Diffusely increased echogenicity within the hepatic parenchyma, nonspecific, but most commonly seen with steatosis. 2. Prior cholecystectomy.  No biliary dilatation. 3. Normal sonographic evaluation of the kidneys. No hydronephrosis or other acute abnormality. Electronically Signed   By: Rise MuBenjamin  McClintock M.D.   On: 05/07/2020 22:47   DG Chest Port 1 View  Result Date: 05/05/2020 CLINICAL DATA:  Body aches since Tuesday, history hypertension EXAM: PORTABLE CHEST 1 VIEW COMPARISON:  Portable exam 1203 hours compared to 08/20/2014 FINDINGS: Borderline enlargement of cardiac silhouette. Mediastinal contours normal. Patchy BILATERAL pulmonary infiltrates, favor multifocal pneumonia. Minimal streaky atelectasis LEFT base. No pleural effusion or pneumothorax. Bones demineralized. IMPRESSION: Patchy BILATERAL pulmonary infiltrates question multifocal pneumonia. Electronically Signed   By: Ulyses SouthwardMark  Boles M.D.   On: 05/02/2020 12:14    Pamella Pertostin Surie Suchocki, MD, PhD [facility]  Between 7 am - 7 pm I am available, please contact me via Amion or Securechat  Between 7 pm - 7 am I am not available, please contact night coverage MD/APP via Amion

## 2020-05-21 NOTE — TOC Initial Note (Signed)
Transition of Care [facility]) - Initial/Assessment Note    Patient Details  Name: Teresa Gillespie MRN: [id number] Date of Birth: [birth year]  Transition of [facility]) CM/SW Contact:    Leeroy Cha, RN Phone Number: 05/21/2020, 8:48 AM  Clinical Narrative:                 74 y.o. female with medical history of 74 year old female with a remote history of hypertension and no other documented chronic medical problems presenting with 2-day history of shortness of breath, coughing, and fevers.  The patient states that she began feeling bad after going to a funeral on the morning of 05/18/2020.  The patient had been in her usual state of health prior to her symptoms.  She also complained of a nonproductive cough with some myalgias and a sore throat.  She denied any headache, chest pain, hemoptysis, nausea, vomiting, diarrhea, abdominal pain, dysuria, hematuria.  Because of worsening shortness of breath, EMS was activated.  The patient was noted to have oxygen saturation in the 50% range.  She was placed on nonrebreather with oxygen saturations into the 60s. In the emergency department, the patient was afebrile with soft blood pressures of 103/65.  BMP showed a serum creatinine of 2.79 and potassium of 5.4.  AST 1074, ALT 501, alk phosphatase 70, total bilirubin 1.2, albumin 3.5.  WBC 9.7, hemoglobin 15.4, platelets 197,000.  The patient was given a dose of Solu-Medrol and a dose of levofloxacin.  Chest x-ray showed bilateral patchy opacities.  BNP was 231.  ABG showed 7.40/35/48/22 on 100%.  She was placed on a nonrebreather and 15LHFNC with oxygen saturation 88-91% PLAN:to return to home with husband Following for progression- pt is now 60l/hfnc  Expected Discharge Plan: Home/Self Care Barriers to Discharge: No Barriers Identified   Patient Goals and CMS Choice Patient states their goals for this hospitalization and ongoing recovery are:: to go home CMS Medicare.gov Compare Post Acute Care list  provided to:: Patient Choice offered to / list presented to : Patient  Expected Discharge Plan and Services Expected Discharge Plan: Home/Self Care   Discharge Planning Services: CM Consult   Living arrangements for the past 2 months: Single Family Home                                      Prior Living Arrangements/Services Living arrangements for the past 2 months: Single Family Home Lives with:: Spouse Patient language and need for interpreter reviewed:: Yes Do you feel safe going back to the place where you live?: Yes      Need for Family Participation in Patient Care: Yes (Comment) Care giver support system in place?: Yes (comment)   Criminal Activity/Legal Involvement Pertinent to Current Situation/Hospitalization: No - Comment as needed  Activities of Daily Living Home Assistive Devices/Equipment: None ADL Screening (condition at time of admission) Patient's cognitive ability adequate to safely complete daily activities?: Yes Is the patient deaf or have difficulty hearing?: No Does the patient have difficulty seeing, even when wearing glasses/contacts?: No Does the patient have difficulty concentrating, remembering, or making decisions?: No Patient able to express need for assistance with ADLs?: Yes Does the patient have difficulty dressing or bathing?: No Independently performs ADLs?: Yes (appropriate for developmental age) Does the patient have difficulty walking or climbing stairs?: No Weakness of Legs: None Weakness of Arms/Hands: None  Permission Sought/Granted  Emotional Assessment Appearance:: Appears stated age Attitude/Demeanor/Rapport: Engaged Affect (typically observed): Calm Orientation: : Oriented to Place, Oriented to Self, Oriented to  Time, Oriented to Situation Alcohol / Substance Use: Not Applicable Psych Involvement: No (comment)  Admission diagnosis:  Acute respiratory failure due to COVID-19 ([place]) [U07.1,  J96.00] COVID-19 [U07.1] Patient Active Problem List   Diagnosis Date Noted  . Acute respiratory failure due to COVID-19 ([place]) 05/19/2020  . AKI (acute kidney injury) ([place]) 05/02/2020  . Transaminasemia 05/19/2020  . Hyperkalemia 05/11/2020  . Dental abscess 09/07/2016  . Facial swelling 09/07/2016  . Pain, dental 09/07/2016  . Essential hypertension 09/07/2016   PCP:  Celene Squibb, MD Pharmacy:   [place], [place] - [place] [address] [place] [address] [place] [address] Phone: [phone] Fax: [phone]     Social Determinants of Health (SDOH) Interventions    Readmission Risk Interventions No flowsheet data found.

## 2020-05-21 NOTE — Progress Notes (Signed)
  Echocardiogram 2D Echocardiogram has been performed.  Teresa Gillespie 05/21/2020, 10:36 AM

## 2020-05-22 DIAGNOSIS — E875 Hyperkalemia: Secondary | ICD-10-CM | POA: Diagnosis not present

## 2020-05-22 DIAGNOSIS — U071 COVID-19: Secondary | ICD-10-CM | POA: Diagnosis not present

## 2020-05-22 DIAGNOSIS — R7401 Elevation of levels of liver transaminase levels: Secondary | ICD-10-CM | POA: Diagnosis not present

## 2020-05-22 DIAGNOSIS — N179 Acute kidney failure, unspecified: Secondary | ICD-10-CM | POA: Diagnosis not present

## 2020-05-22 LAB — FERRITIN: Ferritin: 2156 ng/mL — ABNORMAL HIGH (ref 11–307)

## 2020-05-22 LAB — GLUCOSE, CAPILLARY
Glucose-Capillary: 196 mg/dL — ABNORMAL HIGH (ref 70–99)
Glucose-Capillary: 219 mg/dL — ABNORMAL HIGH (ref 70–99)
Glucose-Capillary: 190 mg/dL — ABNORMAL HIGH (ref 70–99)
Glucose-Capillary: 131 mg/dL — ABNORMAL HIGH (ref 70–99)

## 2020-05-22 LAB — COMPREHENSIVE METABOLIC PANEL
ALT: 377 U/L — ABNORMAL HIGH (ref 0–44)
AST: 373 U/L — ABNORMAL HIGH (ref 15–41)
Albumin: 3.2 g/dL — ABNORMAL LOW (ref 3.5–5.0)
Alkaline Phosphatase: 70 U/L (ref 38–126)
Anion gap: 13 (ref 5–15)
BUN: 36 mg/dL — ABNORMAL HIGH (ref 8–23)
CO2: 27 mmol/L (ref 22–32)
Calcium: 8.3 mg/dL — ABNORMAL LOW (ref 8.9–10.3)
Chloride: 110 mmol/L (ref 98–111)
Creatinine, Ser: 0.93 mg/dL (ref 0.44–1.00)
GFR, Estimated: >60 mL/min (ref >60)
Glucose, Bld: 184 mg/dL — ABNORMAL HIGH (ref 70–99)
Potassium: 4 mmol/L (ref 3.5–5.1)
Sodium: 150 mmol/L — ABNORMAL HIGH (ref 135–145)
Total Bilirubin: 0.8 mg/dL (ref 0.3–1.2)
Total Protein: 7.5 g/dL (ref 6.5–8.1)

## 2020-05-22 LAB — CBC WITH DIFFERENTIAL/PLATELET
Abs Immature Granulocytes: 0.46 10*3/uL — ABNORMAL HIGH (ref 0.00–0.07)
Basophils Absolute: 0 10*3/uL (ref 0.0–0.1)
Basophils Relative: 0 %
Eosinophils Absolute: 0 10*3/uL (ref 0.0–0.5)
Eosinophils Relative: 0 %
HCT: 46.7 % — ABNORMAL HIGH (ref 36.0–46.0)
Hemoglobin: 14.8 g/dL (ref 12.0–15.0)
Immature Granulocytes: 3 %
Lymphocytes Relative: 7 %
Lymphs Abs: 1.1 10*3/uL (ref 0.7–4.0)
MCH: 27.1 pg (ref 26.0–34.0)
MCHC: 31.7 g/dL (ref 30.0–36.0)
MCV: 85.5 fL (ref 80.0–100.0)
Monocytes Absolute: 0.5 10*3/uL (ref 0.1–1.0)
Monocytes Relative: 3 %
Neutro Abs: 14 10*3/uL — ABNORMAL HIGH (ref 1.7–7.7)
Neutrophils Relative %: 87 %
Platelets: 206 10*3/uL (ref 150–400)
RBC: 5.46 MIL/uL — ABNORMAL HIGH (ref 3.87–5.11)
RDW: 15.4 % (ref 11.5–15.5)
WBC: 16.1 10*3/uL — ABNORMAL HIGH (ref 4.0–10.5)
nRBC: 0.6 % — ABNORMAL HIGH (ref 0.0–0.2)

## 2020-05-22 LAB — HEMOGLOBIN A1C
Hgb A1c MFr Bld: 6.6 % — ABNORMAL HIGH (ref 4.8–5.6)
Mean Plasma Glucose: 142.72 mg/dL

## 2020-05-22 LAB — D-DIMER, QUANTITATIVE: D-Dimer, Quant: 5.23 ug/mL-FEU — ABNORMAL HIGH (ref 0.00–0.50)

## 2020-05-22 LAB — MAGNESIUM: Magnesium: 3.5 mg/dL — ABNORMAL HIGH (ref 1.7–2.4)

## 2020-05-22 LAB — PHOSPHORUS: Phosphorus: 2.1 mg/dL — ABNORMAL LOW (ref 2.5–4.6)

## 2020-05-22 LAB — C-REACTIVE PROTEIN: CRP: 15.75 mg/dL — ABNORMAL HIGH (ref <1.0)

## 2020-05-22 MED ORDER — POTASSIUM PHOSPHATES 15 MMOLE/5ML IV SOLN
10.0000 mmol | Freq: Once | INTRAVENOUS | Status: AC
  Administered 2020-05-22: 10 mmol via INTRAVENOUS
  Filled 2020-05-22: qty 3.33

## 2020-05-22 MED ORDER — LEVOFLOXACIN IN D5W 750 MG/150ML IV SOLN
750.0000 mg | INTRAVENOUS | Status: DC
  Administered 2020-05-22 – 2020-05-24 (×3): 750 mg via INTRAVENOUS
  Filled 2020-05-22 (×3): qty 150

## 2020-05-22 MED ORDER — SODIUM CHLORIDE 0.9 % IV SOLN
200.0000 mg | Freq: Once | INTRAVENOUS | Status: AC
  Administered 2020-05-22: 200 mg via INTRAVENOUS
  Filled 2020-05-22: qty 40

## 2020-05-22 MED ORDER — INSULIN ASPART 100 UNIT/ML ~~LOC~~ SOLN
0.0000 [IU] | Freq: Three times a day (TID) | SUBCUTANEOUS | Status: DC
  Administered 2020-05-22: 3 [IU] via SUBCUTANEOUS
  Administered 2020-05-22 (×2): 2 [IU] via SUBCUTANEOUS
  Administered 2020-05-23: 5 [IU] via SUBCUTANEOUS
  Administered 2020-05-23: 3 [IU] via SUBCUTANEOUS
  Administered 2020-05-23 – 2020-05-24 (×2): 1 [IU] via SUBCUTANEOUS
  Administered 2020-05-24: 5 [IU] via SUBCUTANEOUS
  Administered 2020-05-24: 2 [IU] via SUBCUTANEOUS
  Administered 2020-05-25 (×2): 3 [IU] via SUBCUTANEOUS
  Administered 2020-05-26 (×2): 2 [IU] via SUBCUTANEOUS
  Administered 2020-05-26: 3 [IU] via SUBCUTANEOUS

## 2020-05-22 MED ORDER — ENOXAPARIN SODIUM 40 MG/0.4ML ~~LOC~~ SOLN
40.0000 mg | Freq: Every day | SUBCUTANEOUS | Status: DC
  Administered 2020-05-22 – 2020-05-25 (×4): 40 mg via SUBCUTANEOUS
  Filled 2020-05-22 (×4): qty 0.4

## 2020-05-22 MED ORDER — SODIUM CHLORIDE 0.9 % IV SOLN
100.0000 mg | Freq: Every day | INTRAVENOUS | Status: AC
  Administered 2020-05-23 – 2020-05-26 (×4): 100 mg via INTRAVENOUS
  Filled 2020-05-22 (×4): qty 20

## 2020-05-22 NOTE — Progress Notes (Signed)
PROGRESS NOTE  Teresa Gillespie ZOX:[id number] DOB: 06-01-1946 DOA: 06-01-2020 PCP: Benita Stabile, MD   LOS: 2 days   Brief Narrative / Interim history: 74 year old female with history of essential hypertension but no other significant medical problems, comes into the hospital with about a week long viral illness with myalgias, cough, fevers, as well as progressive shortness of breath.  She apparently started feeling really bad after attending a funeral on 11/23.  She came initially to [facility] when she was found to have sats in the 50s on room air requiring heated high flow oxygen.  Chest x-ray showed bilateral patchy opacities.  She was also found to be in renal failure as well as have acute liver injury  Subjective / 24h Interval events: -Feels improved.  Has not attempted to walk or any efforts as she feels very short of breath.  Assessment & Plan:  Principal Problem Acute Hypoxic Respiratory Failure due to Covid-19 Viral Illness -Remains profoundly hypoxic, 15 L and 75% FiO2 -Maintaining sats in the low 90s at rest but desatted while sleeping -Overall appears comfortable -OK with mild hypoxemia, goal at rest is > 85% SaO2, with movement ideally > 75% -encouraged the patient to sit up in chair in the daytime use I-S and flutter valve for pulmonary toiletry and then prone in bed when at night. -LFTs are getting better and start remdesivir today.  Monitor. -Placed on Levaquin due to elevated procalcitonin, continue for 5 days  FiO2 (%):  [75 %-100 %] 75 %    COVID-19 Labs  Recent Labs    01-Jun-2020 1208 01-Jun-2020 1354 05/21/20 0250 05/22/20 0213  DDIMER 3.85*  --  3.24* 5.23*  FERRITIN  --   --  2,593* 2,156*  LDH  --  2,608*  --   --   CRP  --   --  22.4* 15.75*    Lab Results  Component Value Date   SARSCOV2NAA POSITIVE (A) 2020-06-01    Active Problems Acute kidney injury -Likely multifactorial in the setting of poor p.o. intake prior to admission,  probable prolonged hypoxia, creatinine 2.79 in the ED  -Renal ultrasound unremarkable -Creatinine normalized with IV fluids  Acute liver injury -Secondary to COVID-19 infection, prolonged hypoxia, liver ultrasound with evidence of steatosis but no other acute findings.  She has a prior cholecystectomy without biliary dilatation. -LFTs improving, start remdesivir and monitor  Essential hypertension -Monitor  Scheduled Meds: . Chlorhexidine Gluconate Cloth  6 each Topical Daily  . heparin  5,000 Units Subcutaneous Q8H  . insulin aspart  0-9 Units Subcutaneous TID WC  . mouth rinse  15 mL Mouth Rinse BID  . methylPREDNISolone (SOLU-MEDROL) injection  1 mg/kg Intravenous Q12H   Followed by  . [START ON 05/24/2020] predniSONE  50 mg Oral Daily  . saline  1 application Each Nare Q6H   Continuous Infusions: . levofloxacin (LEVAQUIN) IV Stopped (05/22/20 0538)  . potassium PHOSPHATE IVPB (in mmol) 10 mmol (05/22/20 1041)  . [START ON 05/23/2020] remdesivir 100 mg in NS 100 mL     PRN Meds:.albuterol, lip balm, ondansetron **OR** ondansetron (ZOFRAN) IV  DVT prophylaxis: Lovenox Code Status: Full code Family Communication: Called daughter Alvis Lemmings [phone]   Status is: Inpatient  Remains inpatient appropriate because:Inpatient level of care appropriate due to severity of illness   Dispo: The patient is from: Home              Anticipated d/c is to: Home  Anticipated d/c date is: > 3 days              Patient currently is not medically stable to d/c.   Consultants:  None   Procedures:  None   Microbiology: None   Antibacterials: Levaquin 11/25   Objective: Vitals:   05/22/20 0700 05/22/20 0752 05/22/20 0800 05/22/20 0900  BP: (!) 154/98  (!) 163/79   Pulse:      Resp: (!) 24  (!) 27 (!) 22  Temp:      TempSrc:      SpO2:  91%  98%  Weight:      Height:        Intake/Output Summary (Last 24 hours) at 05/22/2020 1109 Last data filed at 05/22/2020  0932 Gross per 24 hour  Intake 657.02 ml  Output 1250 ml  Net -592.98 ml   Filed Weights   Jun 08, 2020 1900  Weight: 85.9 kg    Examination:  Constitutional: NAD Eyes: No icterus ENMT: Moist mucous membranes Neck: normal, supple Respiratory: Bibasilar rhonchi, no wheezing or crackles, tachypneic Cardiovascular: Regular rate and rhythm, no murmurs Abdomen: Soft, nontender, nondistended, bowel sounds positive Musculoskeletal: no clubbing / cyanosis.  Skin: No rashes seen Neurologic: No focal deficits   Data Reviewed: I have independently reviewed following labs and imaging studies   CBC: Recent Labs  Lab 06-08-2020 1208 05/21/20 0250 05/22/20 0213  WBC 9.7 10.7* 16.1*  NEUTROABS 7.3 8.7* 14.0*  HGB 15.4* 14.5 14.8  HCT 51.5* 47.1* 46.7*  MCV 90.5 88.4 85.5  PLT 197 177 206   Basic Metabolic Panel: Recent Labs  Lab 06/08/20 1354 05/21/20 0250 05/22/20 0213  NA 143 145 150*  K 5.4* 4.1 4.0  CL 101 106 110  CO2 20* 25 27  GLUCOSE 219* 170* 184*  BUN 55* 57* 36*  CREATININE 2.79* 1.64* 0.93  CALCIUM 8.8* 8.3* 8.3*  MG  --  3.4* 3.5*  PHOS  --  3.1 2.1*   GFR: Estimated Creatinine Clearance: 51.7 mL/min (by C-G formula based on SCr of 0.93 mg/dL). Liver Function Tests: Recent Labs  Lab June 08, 2020 1354 05/21/20 0250 05/22/20 0213  AST 1,074* 989* 373*  ALT 501* 481* 377*  ALKPHOS 70 64 70  BILITOT 1.2 0.9 0.8  PROT 8.0 7.3 7.5  ALBUMIN 3.5 3.4* 3.2*   No results for input(s): LIPASE, AMYLASE in the last 168 hours. No results for input(s): AMMONIA in the last 168 hours. Coagulation Profile: Recent Labs  Lab 06/08/20 1208  INR 1.3*   Cardiac Enzymes: No results for input(s): CKTOTAL, CKMB, CKMBINDEX, TROPONINI in the last 168 hours. BNP (last 3 results) No results for input(s): PROBNP in the last 8760 hours. HbA1C: Recent Labs    05/22/20 0726  HGBA1C 6.6*   CBG: Recent Labs  Lab 05/22/20 0856  GLUCAP 196*   Lipid Profile: No results for  input(s): CHOL, HDL, LDLCALC, TRIG, CHOLHDL, LDLDIRECT in the last 72 hours. Thyroid Function Tests: No results for input(s): TSH, T4TOTAL, FREET4, T3FREE, THYROIDAB in the last 72 hours. Anemia Panel: Recent Labs    05/21/20 0250 05/22/20 0213  FERRITIN 2,593* 2,156*   Urine analysis:    Component Value Date/Time   COLORURINE YELLOW 02/21/2017 1133   APPEARANCEUR HAZY (A) 02/21/2017 1133   LABSPEC 1.025 02/21/2017 1133   PHURINE 5.0 02/21/2017 1133   GLUCOSEU NEGATIVE 02/21/2017 1133   HGBUR NEGATIVE 02/21/2017 1133   BILIRUBINUR NEGATIVE 02/21/2017 1133   KETONESUR NEGATIVE 02/21/2017 1133   PROTEINUR  NEGATIVE 02/21/2017 1133   UROBILINOGEN 0.2 02/28/2010 1254   NITRITE NEGATIVE 02/21/2017 1133   LEUKOCYTESUR LARGE (A) 02/21/2017 1133   Sepsis Labs: Invalid input(s): PROCALCITONIN, LACTICIDVEN  Recent Results (from the past 240 hour(s))  Blood Culture (routine x 2)     Status: None (Preliminary result)   Collection Time: 05/19/2020 12:08 PM   Specimen: BLOOD RIGHT ARM  Result Value Ref Range Status   Specimen Description BLOOD RIGHT ARM DRAWN BY RN  Final   Special Requests   Final    BOTTLES DRAWN AEROBIC AND ANAEROBIC Blood Culture adequate volume   Culture   Final    NO GROWTH 2 DAYS Performed at [facility], [address] [place], [place] [address]    Report Status PENDING  Incomplete  Blood Culture (routine x 2)     Status: None (Preliminary result)   Collection Time: 05/18/2020 12:08 PM   Specimen: BLOOD LEFT ARM  Result Value Ref Range Status   Specimen Description BLOOD LEFT ARM DRAWN BY RN  Final   Special Requests   Final    BOTTLES DRAWN AEROBIC AND ANAEROBIC Blood Culture results may not be optimal due to an excessive volume of blood received in culture bottles   Culture   Final    NO GROWTH 2 DAYS Performed at [facility], [address] [place], [place] [address]    Report Status PENDING  Incomplete  Resp Panel by RT-PCR (Flu A&B, Covid)  Nasopharyngeal Swab     Status: Abnormal   Collection Time: 05/13/2020 12:20 PM   Specimen: Nasopharyngeal Swab; Nasopharyngeal(NP) swabs in vial transport medium  Result Value Ref Range Status   SARS Coronavirus 2 by RT PCR POSITIVE (A) NEGATIVE Final    Comment: RESULT CALLED TO, READ BACK BY AND VERIFIED WITH: MARTIN D. AT 1335 [id number] BY THOMPSON S. (NOTE) SARS-CoV-2 target nucleic acids are DETECTED.  The SARS-CoV-2 RNA is generally detectable in upper respiratory specimens during the acute phase of infection. Positive results are indicative of the presence of the identified virus, but do not rule out bacterial infection or co-infection with other pathogens not detected by the test. Clinical correlation with patient history and other diagnostic information is necessary to determine patient infection status. The expected result is Negative.  Fact Sheet for Patients: BloggerCourse.comhttps://www.fda.gov/media/152166/download  Fact Sheet for Healthcare Providers: SeriousBroker.ithttps://www.fda.gov/media/152162/download  This test is not yet approved or cleared by the [place] FDA and  has been authorized for detection and/or diagnosis of SARS-CoV-2 by FDA under an Emergency Use Authorization (EUA).  This EUA will remain in effect (meaning this test  can be used) for the duration of  the COVID-19 declaration under Section 564(b)(1) of the Act, 21 U.S.C. section 360bbb-3(b)(1), unless the authorization is terminated or revoked sooner.     Influenza A by PCR NEGATIVE NEGATIVE Final   Influenza B by PCR NEGATIVE NEGATIVE Final    Comment: (NOTE) The Xpert Xpress SARS-CoV-2/FLU/RSV plus assay is intended as an aid in the diagnosis of influenza from Nasopharyngeal swab specimens and should not be used as a sole basis for treatment. Nasal washings and aspirates are unacceptable for Xpert Xpress SARS-CoV-2/FLU/RSV testing.  Fact Sheet for Patients: BloggerCourse.comhttps://www.fda.gov/media/152166/download  Fact Sheet for  Healthcare Providers: SeriousBroker.ithttps://www.fda.gov/media/152162/download  This test is not yet approved or cleared by the [place] FDA and has been authorized for detection and/or diagnosis of SARS-CoV-2 by FDA under an Emergency Use Authorization (EUA). This EUA will remain in effect (meaning this test can  be used) for the duration of the COVID-19 declaration under Section 564(b)(1) of the Act, 21 U.S.C. section 360bbb-3(b)(1), unless the authorization is terminated or revoked.  Performed at [facility], [address], [place], [place] [address]   MRSA PCR Screening     Status: None   Collection Time: 06/14/20  7:12 PM   Specimen: Nasal Mucosa; Nasopharyngeal  Result Value Ref Range Status   MRSA by PCR NEGATIVE NEGATIVE Final    Comment:        The GeneXpert MRSA Assay (FDA approved for NASAL specimens only), is one component of a comprehensive MRSA colonization surveillance program. It is not intended to diagnose MRSA infection nor to guide or monitor treatment for MRSA infections. Performed at [facility], [address], [place], [place] [address]       Radiology Studies: US Abdomen Complete  Result Date: 06-14-20 CLINICAL DATA:  Initial evaluation for acute kidney injury, elevated transaminases. EXAM: ABDOMEN ULTRASOUND COMPLETE COMPARISON:  Prior CT from 02/28/2010. FINDINGS: Gallbladder: Prior cholecystectomy. Common bile duct: Diameter: 2.6 mm Liver: No focal lesion identified. Diffusely increased echogenicity seen within the hepatic parenchyma. Portal vein is patent on color Doppler imaging with normal direction of blood flow towards the liver. IVC: No abnormality visualized. Pancreas: Visualized portion unremarkable. Spleen: Size and appearance within normal limits. Right Kidney: Length: 12.2 cm. Echogenicity within normal limits. No mass or hydronephrosis visualized. No nephrolithiasis. Left Kidney: Length: 11.2 cm. Echogenicity within normal  limits. No mass or hydronephrosis visualized. No nephrolithiasis. Abdominal aorta: No aneurysm visualized. Other findings: None. IMPRESSION: 1. Diffusely increased echogenicity within the hepatic parenchyma, nonspecific, but most commonly seen with steatosis. 2. Prior cholecystectomy.  No biliary dilatation. 3. Normal sonographic evaluation of the kidneys. No hydronephrosis or other acute abnormality. Electronically Signed   By: Rise Mu M.D.   On: Jun 14, 2020 22:47   DG Chest Port 1 View  Result Date: 06-14-2020 CLINICAL DATA:  Body aches since Tuesday, history hypertension EXAM: PORTABLE CHEST 1 VIEW COMPARISON:  Portable exam 1203 hours compared to 08/20/2014 FINDINGS: Borderline enlargement of cardiac silhouette. Mediastinal contours normal. Patchy BILATERAL pulmonary infiltrates, favor multifocal pneumonia. Minimal streaky atelectasis LEFT base. No pleural effusion or pneumothorax. Bones demineralized. IMPRESSION: Patchy BILATERAL pulmonary infiltrates question multifocal pneumonia. Electronically Signed   By: Ulyses Southward M.D.   On: 06/14/20 12:14   ECHOCARDIOGRAM COMPLETE  Result Date: 05/21/2020    ECHOCARDIOGRAM REPORT   Patient Name:   GIRTIE WIERSMA Gancarz Date of Exam: 05/21/2020 Medical Rec #:  [id number]          Height:       60.0 in Accession #:    [id number]         Weight:       189.4 lb Date of Birth:  10/19/1945          BSA:          1.824 m Patient Age:    74 years           BP:           135/65 mmHg Patient Gender: F                  HR:           67 bpm. Exam Location:  Inpatient Procedure: 2D Echo, Color Doppler and Cardiac Doppler Indications:    R07.9* Chest pain, unspecified. Elevated troponin.  History:        Patient has no prior  history of Echocardiogram examinations.                 Signs/Symptoms:Dyspnea and Shortness of Breath. Covid 19                 positive. Respiratory failure. Hypoxia.  Sonographer:    Sheralyn Boatman RDCS Referring Phys: [phone] DAVID TAT   Sonographer Comments: Technically difficult study due to poor echo windows and patient is morbidly obese. Image acquisition challenging due to patient body habitus. IMPRESSIONS  1. Left ventricular ejection fraction, by estimation, is 55 to 60%. The left ventricle has normal function. The left ventricle has no regional wall motion abnormalities. There is moderate concentric left ventricular hypertrophy. Left ventricular diastolic parameters are consistent with Grade I diastolic dysfunction (impaired relaxation).  2. Right ventricular systolic function is normal. The right ventricular size is normal. There is mildly elevated pulmonary artery systolic pressure. The estimated right ventricular systolic pressure is 37.2 mmHg.  3. Left atrial size was mildly dilated.  4. The mitral valve is normal in structure. Trivial mitral valve regurgitation. No evidence of mitral stenosis.  5. The aortic valve is tricuspid. Aortic valve regurgitation is mild. Mild aortic valve sclerosis is present, with no evidence of aortic valve stenosis. FINDINGS  Left Ventricle: Left ventricular ejection fraction, by estimation, is 55 to 60%. The left ventricle has normal function. The left ventricle has no regional wall motion abnormalities. The left ventricular internal cavity size was normal in size. There is  moderate concentric left ventricular hypertrophy. Left ventricular diastolic parameters are consistent with Grade I diastolic dysfunction (impaired relaxation). Indeterminate filling pressures. Right Ventricle: The right ventricular size is normal. No increase in right ventricular wall thickness. Right ventricular systolic function is normal. There is mildly elevated pulmonary artery systolic pressure. The tricuspid regurgitant velocity is 2.70  m/s, and with an assumed right atrial pressure of 8 mmHg, the estimated right ventricular systolic pressure is 37.2 mmHg. Left Atrium: Left atrial size was mildly dilated. Right Atrium: Right  atrial size was normal in size. Pericardium: There is no evidence of pericardial effusion. Mitral Valve: The mitral valve is normal in structure. Mild mitral annular calcification. Trivial mitral valve regurgitation. No evidence of mitral valve stenosis. Tricuspid Valve: The tricuspid valve is normal in structure. Tricuspid valve regurgitation is mild. Aortic Valve: The aortic valve is tricuspid. Aortic valve regurgitation is mild. Aortic regurgitation PHT measures 544 msec. Mild aortic valve sclerosis is present, with no evidence of aortic valve stenosis. Pulmonic Valve: The pulmonic valve was grossly normal. Pulmonic valve regurgitation is mild. Aorta: The aortic root and ascending aorta are structurally normal, with no evidence of dilitation. IAS/Shunts: No atrial level shunt detected by color flow Doppler.  LEFT VENTRICLE PLAX 2D LVIDd:         2.90 cm     Diastology LVIDs:         1.80 cm     LV e' medial:    5.00 cm/s LV PW:         1.80 cm     LV E/e' medial:  14.8 LV IVS:        1.70 cm     LV e' lateral:   6.09 cm/s LVOT diam:     1.90 cm     LV E/e' lateral: 12.1 LV SV:         62 LV SV Index:   34 LVOT Area:     2.84 cm  LV Volumes (MOD) LV vol  d, MOD A2C: 65.1 ml LV vol d, MOD A4C: 67.6 ml LV vol s, MOD A2C: 19.0 ml LV vol s, MOD A4C: 19.5 ml LV SV MOD A2C:     46.1 ml LV SV MOD A4C:     67.6 ml LV SV MOD BP:      43.4 ml RIGHT VENTRICLE             IVC RV S prime:     13.80 cm/s  IVC diam: 1.40 cm TAPSE (M-mode): 2.2 cm LEFT ATRIUM             Index       RIGHT ATRIUM           Index LA diam:        3.80 cm 2.08 cm/m  RA Area:     16.50 cm LA Vol (A2C):   29.2 ml 16.01 ml/m RA Volume:   35.60 ml  19.52 ml/m LA Vol (A4C):   34.3 ml 18.81 ml/m LA Biplane Vol: 34.0 ml 18.64 ml/m  AORTIC VALVE             PULMONIC VALVE LVOT Vmax:   120.00 cm/s PR End Diast Vel: 1.95 msec LVOT Vmean:  74.600 cm/s LVOT VTI:    0.217 m AI PHT:      544 msec  AORTA Ao Root diam: 3.40 cm Ao Asc diam:  3.70 cm MITRAL  VALVE               TRICUSPID VALVE MV Area (PHT): 3.85 cm    TR Peak grad:   29.2 mmHg MV Decel Time: 197 msec    TR Vmax:        270.00 cm/s MV E velocity: 73.80 cm/s MV A velocity: 89.20 cm/s  SHUNTS MV E/A ratio:  0.83        Systemic VTI:  0.22 m                            Systemic Diam: 1.90 cm Rachelle Hora Croitoru MD Electronically signed by Thurmon Fair MD Signature Date/Time: 05/21/2020/12:07:00 PM    Final     Pamella Pert, MD, PhD [facility]  Between 7 am - 7 pm I am available, please contact me via Amion or Securechat  Between 7 pm - 7 am I am not available, please contact night coverage MD/APP via Amion

## 2020-05-22 NOTE — Progress Notes (Signed)
Pharmacy Antibiotic Note  Teresa Gillespie is a 74 y.o. female presented to t he ED on 05/25/20 with SOB.  CXR showed findings with concern for PNA.Marland Kitchen COVID test came back positive. Pharmacy is consulted to dose levaquin for CAP.  Today, 05/22/2020: - SCR improving: 0.93 (crcl~ 51)   Plan: - change levaquin to 750 mg IV q24h  - monitor renal function closely ____________________________________  Temp (24hrs), Avg:98 F (36.7 C), Min:97.1 F (36.2 C), Max:98.9 F (37.2 C)  Recent Labs  Lab 05-25-20 1208 05/25/20 1354 05/21/20 0250 05/22/20 0213  WBC 9.7  --  10.7* 16.1*  CREATININE  --  2.79* 1.64* 0.93  LATICACIDVEN >11* 8.9*  --   --     Estimated Creatinine Clearance: 51.7 mL/min (by C-G formula based on SCr of 0.93 mg/dL).    Allergies  Allergen Reactions  . Penicillins Hives and Rash     Thank you for allowing pharmacy to be a part of this patient's care.  Maryellen Pile, PharmD 05/22/2020 3:32 AM

## 2020-05-23 DIAGNOSIS — R7401 Elevation of levels of liver transaminase levels: Secondary | ICD-10-CM | POA: Diagnosis not present

## 2020-05-23 DIAGNOSIS — U071 COVID-19: Secondary | ICD-10-CM | POA: Diagnosis not present

## 2020-05-23 DIAGNOSIS — N179 Acute kidney failure, unspecified: Secondary | ICD-10-CM | POA: Diagnosis not present

## 2020-05-23 DIAGNOSIS — E875 Hyperkalemia: Secondary | ICD-10-CM | POA: Diagnosis not present

## 2020-05-23 LAB — CBC WITH DIFFERENTIAL/PLATELET
Abs Immature Granulocytes: 0.35 10*3/uL — ABNORMAL HIGH (ref 0.00–0.07)
Basophils Absolute: 0.1 10*3/uL (ref 0.0–0.1)
Basophils Relative: 1 %
Eosinophils Absolute: 0.1 10*3/uL (ref 0.0–0.5)
Eosinophils Relative: 0 %
HCT: 50.5 % — ABNORMAL HIGH (ref 36.0–46.0)
Hemoglobin: 15.8 g/dL — ABNORMAL HIGH (ref 12.0–15.0)
Immature Granulocytes: 3 %
Lymphocytes Relative: 10 %
Lymphs Abs: 1.3 10*3/uL (ref 0.7–4.0)
MCH: 26.7 pg (ref 26.0–34.0)
MCHC: 31.3 g/dL (ref 30.0–36.0)
MCV: 85.3 fL (ref 80.0–100.0)
Monocytes Absolute: 0.4 10*3/uL (ref 0.1–1.0)
Monocytes Relative: 3 %
Neutro Abs: 10.7 10*3/uL — ABNORMAL HIGH (ref 1.7–7.7)
Neutrophils Relative %: 83 %
Platelets: 210 10*3/uL (ref 150–400)
RBC: 5.92 MIL/uL — ABNORMAL HIGH (ref 3.87–5.11)
RDW: 15.7 % — ABNORMAL HIGH (ref 11.5–15.5)
WBC: 12.9 10*3/uL — ABNORMAL HIGH (ref 4.0–10.5)
nRBC: 1.3 % — ABNORMAL HIGH (ref 0.0–0.2)

## 2020-05-23 LAB — COMPREHENSIVE METABOLIC PANEL
ALT: 283 U/L — ABNORMAL HIGH (ref 0–44)
AST: 170 U/L — ABNORMAL HIGH (ref 15–41)
Albumin: 3.1 g/dL — ABNORMAL LOW (ref 3.5–5.0)
Alkaline Phosphatase: 66 U/L (ref 38–126)
Anion gap: 13 (ref 5–15)
BUN: 33 mg/dL — ABNORMAL HIGH (ref 8–23)
CO2: 24 mmol/L (ref 22–32)
Calcium: 8.2 mg/dL — ABNORMAL LOW (ref 8.9–10.3)
Chloride: 114 mmol/L — ABNORMAL HIGH (ref 98–111)
Creatinine, Ser: 0.81 mg/dL (ref 0.44–1.00)
GFR, Estimated: >60 mL/min (ref >60)
Glucose, Bld: 151 mg/dL — ABNORMAL HIGH (ref 70–99)
Potassium: 4.3 mmol/L (ref 3.5–5.1)
Sodium: 151 mmol/L — ABNORMAL HIGH (ref 135–145)
Total Bilirubin: 0.8 mg/dL (ref 0.3–1.2)
Total Protein: 6.8 g/dL (ref 6.5–8.1)

## 2020-05-23 LAB — MAGNESIUM: Magnesium: 3.8 mg/dL — ABNORMAL HIGH (ref 1.7–2.4)

## 2020-05-23 LAB — GLUCOSE, CAPILLARY
Glucose-Capillary: 138 mg/dL — ABNORMAL HIGH (ref 70–99)
Glucose-Capillary: 268 mg/dL — ABNORMAL HIGH (ref 70–99)
Glucose-Capillary: 215 mg/dL — ABNORMAL HIGH (ref 70–99)
Glucose-Capillary: 188 mg/dL — ABNORMAL HIGH (ref 70–99)

## 2020-05-23 LAB — D-DIMER, QUANTITATIVE: D-Dimer, Quant: 3.68 ug/mL-FEU — ABNORMAL HIGH (ref 0.00–0.50)

## 2020-05-23 LAB — PHOSPHORUS: Phosphorus: 2.5 mg/dL (ref 2.5–4.6)

## 2020-05-23 LAB — FERRITIN: Ferritin: 1095 ng/mL — ABNORMAL HIGH (ref 11–307)

## 2020-05-23 LAB — C-REACTIVE PROTEIN: CRP: 8.6 mg/dL — ABNORMAL HIGH (ref <1.0)

## 2020-05-23 MED ORDER — DEXTROSE 5 % IV SOLN: INTRAVENOUS | Status: AC

## 2020-05-23 MED ORDER — AMLODIPINE BESYLATE 5 MG PO TABS
5.0000 mg | ORAL_TABLET | Freq: Every day | ORAL | Status: DC
  Administered 2020-05-23 – 2020-06-08 (×17): 5 mg via ORAL
  Filled 2020-05-23 (×17): qty 1

## 2020-05-23 MED ORDER — BARICITINIB 2 MG PO TABS
4.0000 mg | ORAL_TABLET | Freq: Every day | ORAL | Status: AC
  Administered 2020-05-23 – 2020-06-05 (×14): 4 mg via ORAL
  Filled 2020-05-23 (×14): qty 2

## 2020-05-23 NOTE — Progress Notes (Signed)
PROGRESS NOTE  Teresa Gillespie:[id number] DOB: Nov 05, 1945 DOA: 2020/05/01 PCP: Benita StabileHall, John Z, MD   LOS: 3 days   Brief Narrative / Interim history: 74 year old female with history of essential hypertension but no other significant medical problems, comes into the hospital with about a week long viral illness with myalgias, cough, fevers, as well as progressive shortness of breath.  She apparently started feeling really bad after attending a funeral on 11/23.  She came initially to [facility] when she was found to have sats in the 50s on room air requiring heated high flow oxygen.  Chest x-ray showed bilateral patchy opacities.  She was also found to be in renal failure as well as have acute liver injury  Subjective / 24h Interval events: -No significant complaints, no chest pain.  Difficulties with taking a deep breath as it causes her to cough.  No abdominal pain, no nausea or vomiting  Assessment & Plan:  Principal Problem Acute Hypoxic Respiratory Failure due to Covid-19 Viral Illness -Maintaining sats in the low 90s at rest but desatted while sleeping -Overall appears comfortable -OK with mild hypoxemia, goal at rest is > 85% SaO2, with movement ideally > 75% -encouraged the patient to sit up in chair in the daytime use I-S and flutter valve for pulmonary toiletry and then prone in bed when at night. -LFTs are better -Continue steroids, remdesivir -Placed on Levaquin due to elevated procalcitonin, continue for 5 days -Remains on 80% FiO2, 40 L, briefly increased to 100% FiO2 overnight due to nighttime desaturations when sleeping  FiO2 (%):  [60 %-100 %] 80 %   Baricitinib is being used under EUA by the FDA. The patient has no ESRD or AKI, known history of TB, severe neutropenia (ANC <500) or lymphopenia (ALC <200), or severe LFT elevations. The option to use/refuse baricitinib treatment under FDA authorization (not approval), the significant known and potential risks  and benefits, the extent to which these are unknown, and information regarding all available alternatives were discussed in detail. Specifically the risk of VTE and secondary infections were discussed in detail with the patient and/or HCPOA. They consent to proceed with treatment.  - Continue baricitinib, started 11/28, for 14 days or until hospital discharge. Monitor Cr, LFTs, differential. Keep on VTE ppx.   COVID-19 Labs  Recent Labs    01-03-20 1208 01-03-20 1354 05/21/20 0250 05/22/20 0213 05/23/20 0252  DDIMER   < >  --  3.24* 5.23* 3.68*  FERRITIN  --   --  2,593* 2,156* 1,095*  LDH  --  2,608*  --   --   --   CRP  --   --  22.4* 15.75* 8.6*   < > = values in this interval not displayed.    Lab Results  Component Value Date   SARSCOV2NAA POSITIVE (A) 2020/05/01    Active Problems Acute kidney injury -Likely multifactorial in the setting of poor p.o. intake prior to admission, probable prolonged hypoxia, creatinine 2.79 in the ED  -Renal ultrasound unremarkable -Creatinine normalized with IV fluids  Hypernatremia -Possibly a degree of dehydration still, limited fluids with D5W today  Acute liver injury -Secondary to COVID-19 infection, prolonged hypoxia, liver ultrasound with evidence of steatosis but no other acute findings.  She has a prior cholecystectomy without biliary dilatation. -LFTs continue to improve  Essential hypertension -Monitor, adding back amlodipine today  Scheduled Meds: . amLODipine  5 mg Oral Daily  . baricitinib  4 mg Oral Daily  . Chlorhexidine  Gluconate Cloth  6 each Topical Daily  . enoxaparin (LOVENOX) injection  40 mg Subcutaneous Daily  . insulin aspart  0-9 Units Subcutaneous TID WC  . mouth rinse  15 mL Mouth Rinse BID  . [START ON 05/24/2020] predniSONE  50 mg Oral Daily  . saline  1 application Each Nare Q6H   Continuous Infusions: . dextrose 50 mL/hr at 05/23/20 0900  . levofloxacin (LEVAQUIN) IV Stopped (05/23/20 0612)  .  remdesivir 100 mg in NS 100 mL Stopped (05/23/20 0854)   PRN Meds:.albuterol, lip balm, ondansetron **OR** ondansetron (ZOFRAN) IV  DVT prophylaxis: Lovenox Code Status: Full code Family Communication: Called daughter Alvis Lemmings [phone]   Status is: Inpatient  Remains inpatient appropriate because:Inpatient level of care appropriate due to severity of illness  Dispo: The patient is from: Home              Anticipated d/c is to: Home              Anticipated d/c date is: > 3 days              Patient currently is not medically stable to d/c.   Consultants:  None   Procedures:  None   Microbiology: None   Antibacterials: Levaquin 11/25   Objective: Vitals:   05/23/20 0726 05/23/20 0800 05/23/20 0840 05/23/20 0900  BP:  (!) 178/71    Pulse:  74    Resp:  19  (!) 25  Temp:  98.3 F (36.8 C)    TempSrc:  Oral    SpO2: 93% 96% 100% 100%  Weight:      Height:        Intake/Output Summary (Last 24 hours) at 05/23/2020 6568 Last data filed at 05/23/2020 0900 Gross per 24 hour  Intake 1015.33 ml  Output --  Net 1015.33 ml   Filed Weights   05/17/2020 1900  Weight: 85.9 kg    Examination:  Constitutional: No distress Eyes: No scleral icterus ENMT: Moist mucous membranes Neck: normal, supple Respiratory: Bibasilar rhonchi, no wheezing, increased respiratory effort Cardiovascular: Regular rate and rhythm, no murmurs appreciated Abdomen: Soft, NT, ND, bowel sounds positive Musculoskeletal: no clubbing / cyanosis.  Skin: No rashes appreciated Neurologic: Nonfocal, equal strength   Data Reviewed: I have independently reviewed following labs and imaging studies   CBC: Recent Labs  Lab 04/27/2020 1208 05/21/20 0250 05/22/20 0213 05/23/20 0839  WBC 9.7 10.7* 16.1* 12.9*  NEUTROABS 7.3 8.7* 14.0* PENDING  HGB 15.4* 14.5 14.8 15.8*  HCT 51.5* 47.1* 46.7* 50.5*  MCV 90.5 88.4 85.5 85.3  PLT 197 177 206 210   Basic Metabolic Panel: Recent Labs  Lab  05/03/2020 1354 05/21/20 0250 05/22/20 0213 05/23/20 0252  NA 143 145 150* 151*  K 5.4* 4.1 4.0 4.3  CL 101 106 110 114*  CO2 20* 25 27 24   GLUCOSE 219* 170* 184* 151*  BUN 55* 57* 36* 33*  CREATININE 2.79* 1.64* 0.93 0.81  CALCIUM 8.8* 8.3* 8.3* 8.2*  MG  --  3.4* 3.5* 3.8*  PHOS  --  3.1 2.1* 2.5   GFR: Estimated Creatinine Clearance: 59.4 mL/min (by C-G formula based on SCr of 0.81 mg/dL). Liver Function Tests: Recent Labs  Lab 05/10/2020 1354 05/21/20 0250 05/22/20 0213 05/23/20 0252  AST 1,074* 989* 373* 170*  ALT 501* 481* 377* 283*  ALKPHOS 70 64 70 66  BILITOT 1.2 0.9 0.8 0.8  PROT 8.0 7.3 7.5 6.8  ALBUMIN 3.5 3.4* 3.2* 3.1*  No results for input(s): LIPASE, AMYLASE in the last 168 hours. No results for input(s): AMMONIA in the last 168 hours. Coagulation Profile: Recent Labs  Lab 05/02/2020 1208  INR 1.3*   Cardiac Enzymes: No results for input(s): CKTOTAL, CKMB, CKMBINDEX, TROPONINI in the last 168 hours. BNP (last 3 results) No results for input(s): PROBNP in the last 8760 hours. HbA1C: Recent Labs    05/22/20 0726  HGBA1C 6.6*   CBG: Recent Labs  Lab 05/22/20 0856 05/22/20 1155 05/22/20 1647 05/22/20 2118 05/23/20 0758  GLUCAP 196* 219* 190* 131* 138*   Lipid Profile: No results for input(s): CHOL, HDL, LDLCALC, TRIG, CHOLHDL, LDLDIRECT in the last 72 hours. Thyroid Function Tests: No results for input(s): TSH, T4TOTAL, FREET4, T3FREE, THYROIDAB in the last 72 hours. Anemia Panel: Recent Labs    05/22/20 0213 05/23/20 0252  FERRITIN 2,156* 1,095*   Urine analysis:    Component Value Date/Time   COLORURINE YELLOW 02/21/2017 1133   APPEARANCEUR HAZY (A) 02/21/2017 1133   LABSPEC 1.025 02/21/2017 1133   PHURINE 5.0 02/21/2017 1133   GLUCOSEU NEGATIVE 02/21/2017 1133   HGBUR NEGATIVE 02/21/2017 1133   BILIRUBINUR NEGATIVE 02/21/2017 1133   KETONESUR NEGATIVE 02/21/2017 1133   PROTEINUR NEGATIVE 02/21/2017 1133   UROBILINOGEN 0.2  02/28/2010 1254   NITRITE NEGATIVE 02/21/2017 1133   LEUKOCYTESUR LARGE (A) 02/21/2017 1133   Sepsis Labs: Invalid input(s): PROCALCITONIN, LACTICIDVEN  Recent Results (from the past 240 hour(s))  Blood Culture (routine x 2)     Status: None (Preliminary result)   Collection Time: 05/11/2020 12:08 PM   Specimen: BLOOD RIGHT ARM  Result Value Ref Range Status   Specimen Description BLOOD RIGHT ARM DRAWN BY RN  Final   Special Requests   Final    BOTTLES DRAWN AEROBIC AND ANAEROBIC Blood Culture adequate volume   Culture   Final    NO GROWTH 3 DAYS Performed at [facility], [address], [place], [place] [address]    Report Status PENDING  Incomplete  Blood Culture (routine x 2)     Status: None (Preliminary result)   Collection Time: 05/06/2020 12:08 PM   Specimen: BLOOD LEFT ARM  Result Value Ref Range Status   Specimen Description BLOOD LEFT ARM DRAWN BY RN  Final   Special Requests   Final    BOTTLES DRAWN AEROBIC AND ANAEROBIC Blood Culture results may not be optimal due to an excessive volume of blood received in culture bottles   Culture   Final    NO GROWTH 3 DAYS Performed at [facility], [address] [place], [place] [address]    Report Status PENDING  Incomplete  Resp Panel by RT-PCR (Flu A&B, Covid) Nasopharyngeal Swab     Status: Abnormal   Collection Time: 05/15/2020 12:20 PM   Specimen: Nasopharyngeal Swab; Nasopharyngeal(NP) swabs in vial transport medium  Result Value Ref Range Status   SARS Coronavirus 2 by RT PCR POSITIVE (A) NEGATIVE Final    Comment: RESULT CALLED TO, READ BACK BY AND VERIFIED WITH: MARTIN D. AT 1335 [id number] BY THOMPSON S. (NOTE) SARS-CoV-2 target nucleic acids are DETECTED.  The SARS-CoV-2 RNA is generally detectable in upper respiratory specimens during the acute phase of infection. Positive results are indicative of the presence of the identified virus, but do not rule out bacterial infection or co-infection with other pathogens  not detected by the test. Clinical correlation with patient history and other diagnostic information is necessary to determine patient infection status. The expected  result is Negative.  Fact Sheet for Patients: BloggerCourse.com  Fact Sheet for Healthcare Providers: SeriousBroker.it  This test is not yet approved or cleared by the [place] FDA and  has been authorized for detection and/or diagnosis of SARS-CoV-2 by FDA under an Emergency Use Authorization (EUA).  This EUA will remain in effect (meaning this test  can be used) for the duration of  the COVID-19 declaration under Section 564(b)(1) of the Act, 21 U.S.C. section 360bbb-3(b)(1), unless the authorization is terminated or revoked sooner.     Influenza A by PCR NEGATIVE NEGATIVE Final   Influenza B by PCR NEGATIVE NEGATIVE Final    Comment: (NOTE) The Xpert Xpress SARS-CoV-2/FLU/RSV plus assay is intended as an aid in the diagnosis of influenza from Nasopharyngeal swab specimens and should not be used as a sole basis for treatment. Nasal washings and aspirates are unacceptable for Xpert Xpress SARS-CoV-2/FLU/RSV testing.  Fact Sheet for Patients: BloggerCourse.com  Fact Sheet for Healthcare Providers: SeriousBroker.it  This test is not yet approved or cleared by the [place] FDA and has been authorized for detection and/or diagnosis of SARS-CoV-2 by FDA under an Emergency Use Authorization (EUA). This EUA will remain in effect (meaning this test can be used) for the duration of the COVID-19 declaration under Section 564(b)(1) of the Act, 21 U.S.C. section 360bbb-3(b)(1), unless the authorization is terminated or revoked.  Performed at [facility], [address], [place], [place] [address]   MRSA PCR Screening     Status: None   Collection Time: 05/19/2020  7:12 PM   Specimen: Nasal Mucosa; Nasopharyngeal   Result Value Ref Range Status   MRSA by PCR NEGATIVE NEGATIVE Final    Comment:        The GeneXpert MRSA Assay (FDA approved for NASAL specimens only), is one component of a comprehensive MRSA colonization surveillance program. It is not intended to diagnose MRSA infection nor to guide or monitor treatment for MRSA infections. Performed at [facility], [address] [place], [place] [address]       Radiology Studies: ECHOCARDIOGRAM COMPLETE  Result Date: 05/21/2020    ECHOCARDIOGRAM REPORT   Patient Name:   CHARLSIE FLEEGER Looney Date of Exam: 05/21/2020 Medical Rec #:  [id number]          Height:       60.0 in Accession #:    [id number]         Weight:       189.4 lb Date of Birth:  [birth year]          BSA:          1.824 m Patient Age:    74 years           BP:           135/65 mmHg Patient Gender: F                  HR:           67 bpm. Exam Location:  Inpatient Procedure: 2D Echo, Color Doppler and Cardiac Doppler Indications:    R07.9* Chest pain, unspecified. Elevated troponin.  History:        Patient has no prior history of Echocardiogram examinations.                 Signs/Symptoms:Dyspnea and Shortness of Breath. Covid 19                 positive. Respiratory failure. Hypoxia.  Sonographer:    Sheralyn Boatman RDCS Referring Phys: [phone] DAVID TAT  Sonographer Comments: Technically difficult study due to poor echo windows and patient is morbidly obese. Image acquisition challenging due to patient body habitus. IMPRESSIONS  1. Left ventricular ejection fraction, by estimation, is 55 to 60%. The left ventricle has normal function. The left ventricle has no regional wall motion abnormalities. There is moderate concentric left ventricular hypertrophy. Left ventricular diastolic parameters are consistent with Grade I diastolic dysfunction (impaired relaxation).  2. Right ventricular systolic function is normal. The right ventricular size is normal. There is mildly elevated  pulmonary artery systolic pressure. The estimated right ventricular systolic pressure is 37.2 mmHg.  3. Left atrial size was mildly dilated.  4. The mitral valve is normal in structure. Trivial mitral valve regurgitation. No evidence of mitral stenosis.  5. The aortic valve is tricuspid. Aortic valve regurgitation is mild. Mild aortic valve sclerosis is present, with no evidence of aortic valve stenosis. FINDINGS  Left Ventricle: Left ventricular ejection fraction, by estimation, is 55 to 60%. The left ventricle has normal function. The left ventricle has no regional wall motion abnormalities. The left ventricular internal cavity size was normal in size. There is  moderate concentric left ventricular hypertrophy. Left ventricular diastolic parameters are consistent with Grade I diastolic dysfunction (impaired relaxation). Indeterminate filling pressures. Right Ventricle: The right ventricular size is normal. No increase in right ventricular wall thickness. Right ventricular systolic function is normal. There is mildly elevated pulmonary artery systolic pressure. The tricuspid regurgitant velocity is 2.70  m/s, and with an assumed right atrial pressure of 8 mmHg, the estimated right ventricular systolic pressure is 37.2 mmHg. Left Atrium: Left atrial size was mildly dilated. Right Atrium: Right atrial size was normal in size. Pericardium: There is no evidence of pericardial effusion. Mitral Valve: The mitral valve is normal in structure. Mild mitral annular calcification. Trivial mitral valve regurgitation. No evidence of mitral valve stenosis. Tricuspid Valve: The tricuspid valve is normal in structure. Tricuspid valve regurgitation is mild. Aortic Valve: The aortic valve is tricuspid. Aortic valve regurgitation is mild. Aortic regurgitation PHT measures 544 msec. Mild aortic valve sclerosis is present, with no evidence of aortic valve stenosis. Pulmonic Valve: The pulmonic valve was grossly normal. Pulmonic valve  regurgitation is mild. Aorta: The aortic root and ascending aorta are structurally normal, with no evidence of dilitation. IAS/Shunts: No atrial level shunt detected by color flow Doppler.  LEFT VENTRICLE PLAX 2D LVIDd:         2.90 cm     Diastology LVIDs:         1.80 cm     LV e' medial:    5.00 cm/s LV PW:         1.80 cm     LV E/e' medial:  14.8 LV IVS:        1.70 cm     LV e' lateral:   6.09 cm/s LVOT diam:     1.90 cm     LV E/e' lateral: 12.1 LV SV:         62 LV SV Index:   34 LVOT Area:     2.84 cm  LV Volumes (MOD) LV vol d, MOD A2C: 65.1 ml LV vol d, MOD A4C: 67.6 ml LV vol s, MOD A2C: 19.0 ml LV vol s, MOD A4C: 19.5 ml LV SV MOD A2C:     46.1 ml LV SV MOD A4C:     67.6 ml LV SV  MOD BP:      43.4 ml RIGHT VENTRICLE             IVC RV S prime:     13.80 cm/s  IVC diam: 1.40 cm TAPSE (M-mode): 2.2 cm LEFT ATRIUM             Index       RIGHT ATRIUM           Index LA diam:        3.80 cm 2.08 cm/m  RA Area:     16.50 cm LA Vol (A2C):   29.2 ml 16.01 ml/m RA Volume:   35.60 ml  19.52 ml/m LA Vol (A4C):   34.3 ml 18.81 ml/m LA Biplane Vol: 34.0 ml 18.64 ml/m  AORTIC VALVE             PULMONIC VALVE LVOT Vmax:   120.00 cm/s PR End Diast Vel: 1.95 msec LVOT Vmean:  74.600 cm/s LVOT VTI:    0.217 m AI PHT:      544 msec  AORTA Ao Root diam: 3.40 cm Ao Asc diam:  3.70 cm MITRAL VALVE               TRICUSPID VALVE MV Area (PHT): 3.85 cm    TR Peak grad:   29.2 mmHg MV Decel Time: 197 msec    TR Vmax:        270.00 cm/s MV E velocity: 73.80 cm/s MV A velocity: 89.20 cm/s  SHUNTS MV E/A ratio:  0.83        Systemic VTI:  0.22 m                            Systemic Diam: 1.90 cm Rachelle Hora Croitoru MD Electronically signed by Thurmon Fair MD Signature Date/Time: 05/21/2020/12:07:00 PM    Final     Pamella Pert, MD, PhD [facility]  Between 7 am - 7 pm I am available, please contact me via Amion or Securechat  Between 7 pm - 7 am I am not available, please contact night coverage MD/APP via  Amion

## 2020-05-24 ENCOUNTER — Inpatient Hospital Stay (HOSPITAL_COMMUNITY): Payer: Medicare Other

## 2020-05-24 DIAGNOSIS — R7401 Elevation of levels of liver transaminase levels: Secondary | ICD-10-CM | POA: Diagnosis not present

## 2020-05-24 DIAGNOSIS — R7989 Other specified abnormal findings of blood chemistry: Secondary | ICD-10-CM | POA: Diagnosis not present

## 2020-05-24 DIAGNOSIS — E875 Hyperkalemia: Secondary | ICD-10-CM | POA: Diagnosis not present

## 2020-05-24 DIAGNOSIS — U071 COVID-19: Secondary | ICD-10-CM | POA: Diagnosis not present

## 2020-05-24 DIAGNOSIS — N179 Acute kidney failure, unspecified: Secondary | ICD-10-CM | POA: Diagnosis not present

## 2020-05-24 LAB — CBC WITH DIFFERENTIAL/PLATELET
Abs Immature Granulocytes: 0.33 10*3/uL — ABNORMAL HIGH (ref 0.00–0.07)
Basophils Absolute: 0.1 10*3/uL (ref 0.0–0.1)
Basophils Relative: 0 %
Eosinophils Absolute: 0 10*3/uL (ref 0.0–0.5)
Eosinophils Relative: 0 %
HCT: 48.6 % — ABNORMAL HIGH (ref 36.0–46.0)
Hemoglobin: 15 g/dL (ref 12.0–15.0)
Immature Granulocytes: 3 %
Lymphocytes Relative: 12 %
Lymphs Abs: 1.6 10*3/uL (ref 0.7–4.0)
MCH: 26.8 pg (ref 26.0–34.0)
MCHC: 30.9 g/dL (ref 30.0–36.0)
MCV: 86.9 fL (ref 80.0–100.0)
Monocytes Absolute: 0.5 10*3/uL (ref 0.1–1.0)
Monocytes Relative: 4 %
Neutro Abs: 10.8 10*3/uL — ABNORMAL HIGH (ref 1.7–7.7)
Neutrophils Relative %: 81 %
Platelets: 219 10*3/uL (ref 150–400)
RBC: 5.59 MIL/uL — ABNORMAL HIGH (ref 3.87–5.11)
RDW: 15.7 % — ABNORMAL HIGH (ref 11.5–15.5)
WBC: 13.3 10*3/uL — ABNORMAL HIGH (ref 4.0–10.5)
nRBC: 0.7 % — ABNORMAL HIGH (ref 0.0–0.2)

## 2020-05-24 LAB — COMPREHENSIVE METABOLIC PANEL
ALT: 210 U/L — ABNORMAL HIGH (ref 0–44)
AST: 72 U/L — ABNORMAL HIGH (ref 15–41)
Albumin: 3.2 g/dL — ABNORMAL LOW (ref 3.5–5.0)
Alkaline Phosphatase: 65 U/L (ref 38–126)
Anion gap: 13 (ref 5–15)
BUN: 31 mg/dL — ABNORMAL HIGH (ref 8–23)
CO2: 26 mmol/L (ref 22–32)
Calcium: 8.4 mg/dL — ABNORMAL LOW (ref 8.9–10.3)
Chloride: 108 mmol/L (ref 98–111)
Creatinine, Ser: 0.94 mg/dL (ref 0.44–1.00)
GFR, Estimated: >60 mL/min (ref >60)
Glucose, Bld: 193 mg/dL — ABNORMAL HIGH (ref 70–99)
Potassium: 4 mmol/L (ref 3.5–5.1)
Sodium: 147 mmol/L — ABNORMAL HIGH (ref 135–145)
Total Bilirubin: 0.8 mg/dL (ref 0.3–1.2)
Total Protein: 7 g/dL (ref 6.5–8.1)

## 2020-05-24 LAB — PHOSPHORUS: Phosphorus: 2.5 mg/dL (ref 2.5–4.6)

## 2020-05-24 LAB — GLUCOSE, CAPILLARY
Glucose-Capillary: 175 mg/dL — ABNORMAL HIGH (ref 70–99)
Glucose-Capillary: 139 mg/dL — ABNORMAL HIGH (ref 70–99)
Glucose-Capillary: 260 mg/dL — ABNORMAL HIGH (ref 70–99)
Glucose-Capillary: 173 mg/dL — ABNORMAL HIGH (ref 70–99)

## 2020-05-24 LAB — FERRITIN: Ferritin: 779 ng/mL — ABNORMAL HIGH (ref 11–307)

## 2020-05-24 LAB — D-DIMER, QUANTITATIVE: D-Dimer, Quant: 5.77 ug/mL-FEU — ABNORMAL HIGH (ref 0.00–0.50)

## 2020-05-24 LAB — MAGNESIUM: Magnesium: 3.4 mg/dL — ABNORMAL HIGH (ref 1.7–2.4)

## 2020-05-24 LAB — C-REACTIVE PROTEIN: CRP: 3.5 mg/dL — ABNORMAL HIGH (ref <1.0)

## 2020-05-24 NOTE — Progress Notes (Signed)
PROGRESS NOTE  Teresa Gillespie KNL:[id number] DOB: 1946/06/20 DOA: 2020-05-22 PCP: Benita Stabile, MD   LOS: 4 days   Brief Narrative / Interim history: 74 year old female with history of essential hypertension but no other significant medical problems, comes into the hospital with about a week long viral illness with myalgias, cough, fevers, as well as progressive shortness of breath.  She apparently started feeling really bad after attending a funeral on 11/23.  She came initially to [facility] when she was found to have sats in the 50s on room air requiring heated high flow oxygen.  Chest x-ray showed bilateral patchy opacities.  She was also found to be in renal failure as well as have acute liver injury  Subjective / 24h Interval events: -Overall feeling better.  Has some problem with acute being she is getting water in the nasal cannula.  No chest pain, no abdominal pain, no nausea or vomiting  Assessment & Plan:  Principal Problem Acute Hypoxic Respiratory Failure due to Covid-19 Viral Illness -Maintaining sats in the low 90s at rest but desatted while sleeping -Overall appears comfortable -OK with mild hypoxemia, goal at rest is > 85% SaO2, with movement ideally > 75% -encouraged the patient to sit up in chair in the daytime use I-S and flutter valve for pulmonary toiletry and then prone in bed when at night. -LFTs this morning are pending -Continue steroids, remdesivir -Placed on Levaquin due to elevated procalcitonin, plan for no more than 5 days -Although clinically improving remains profoundly hypoxic -Continue steroids, remdesivir, baricitinib  FiO2 (%):  [60 %-100 %] 100 %   COVID-19 Labs  Recent Labs    05/22/20 0213 05/23/20 0252 05/24/20 0846  DDIMER 5.23* 3.68* 5.77*  FERRITIN 2,156* 1,095*  --   CRP 15.75* 8.6*  --     Lab Results  Component Value Date   SARSCOV2NAA POSITIVE (A) 05-22-20    Active Problems Acute kidney injury -Likely  multifactorial in the setting of poor p.o. intake prior to admission, probable prolonged hypoxia, creatinine 2.79 in the ED  -Renal ultrasound unremarkable -Creatinine normalized with fluids  Hypernatremia -This morning labs are pending  Acute liver injury -Secondary to COVID-19 infection, prolonged hypoxia, liver ultrasound with evidence of steatosis but no other acute findings.  She has a prior cholecystectomy without biliary dilatation. -LFTs this morning pending  Essential hypertension -Back on amlodipine 11/28, blood pressure improved today  Scheduled Meds: . amLODipine  5 mg Oral Daily  . baricitinib  4 mg Oral Daily  . Chlorhexidine Gluconate Cloth  6 each Topical Daily  . enoxaparin (LOVENOX) injection  40 mg Subcutaneous Daily  . insulin aspart  0-9 Units Subcutaneous TID WC  . mouth rinse  15 mL Mouth Rinse BID  . predniSONE  50 mg Oral Daily  . saline  1 application Each Nare Q6H   Continuous Infusions: . levofloxacin (LEVAQUIN) IV Stopped (05/24/20 0655)  . remdesivir 100 mg in NS 100 mL Stopped (05/23/20 0854)   PRN Meds:.albuterol, lip balm, ondansetron **OR** ondansetron (ZOFRAN) IV  DVT prophylaxis: Lovenox Code Status: Full code Family Communication: Daughter Alvis Lemmings [phone]   Status is: Inpatient  Remains inpatient appropriate because:Inpatient level of care appropriate due to severity of illness  Dispo: The patient is from: Home              Anticipated d/c is to: Home              Anticipated d/c date is: > 3  days              Patient currently is not medically stable to d/c.   Consultants:  None   Procedures:  None   Microbiology: None   Antibacterials: Levaquin 11/25   Objective: Vitals:   05/24/20 0700 05/24/20 0800 05/24/20 0815 05/24/20 0900  BP: (!) 165/74 126/79  (!) 136/95  Pulse:    80  Resp: (!) 21 12  (!) 21  Temp:   98.5 F (36.9 C)   TempSrc:   Oral   SpO2:   (!) 88% (!) 89%  Weight:      Height:         Intake/Output Summary (Last 24 hours) at 05/24/2020 [id number] Last data filed at 05/24/2020 [id number] Gross per 24 hour  Intake 1164.68 ml  Output --  Net 1164.68 ml   Filed Weights   10-11-19 1900  Weight: 85.9 kg    Examination:  Constitutional: NAD Eyes: No icterus ENMT: Moist mucous membranes Neck: normal, supple Respiratory: Bibasilar rhonchi, no wheezing, increased respiratory effort Cardiovascular: Regular rate and rhythm, no murmurs Abdomen: Soft, nontender, nondistended, bowel sounds positive Musculoskeletal: no clubbing / cyanosis.  Skin: No rashes appreciated Neurologic: No focal deficits, equal strength   Data Reviewed: I have independently reviewed following labs and imaging studies   CBC: Recent Labs  Lab 10-11-19 1208 05/21/20 0250 05/22/20 0213 05/23/20 0839  WBC 9.7 10.7* 16.1* 12.9*  NEUTROABS 7.3 8.7* 14.0* 10.7*  HGB 15.4* 14.5 14.8 15.8*  HCT 51.5* 47.1* 46.7* 50.5*  MCV 90.5 88.4 85.5 85.3  PLT 197 177 206 210   Basic Metabolic Panel: Recent Labs  Lab 10-11-19 1354 05/21/20 0250 05/22/20 0213 05/23/20 0252  NA 143 145 150* 151*  K 5.4* 4.1 4.0 4.3  CL 101 106 110 114*  CO2 20* 25 27 24   GLUCOSE 219* 170* 184* 151*  BUN 55* 57* 36* 33*  CREATININE 2.79* 1.64* 0.93 0.81  CALCIUM 8.8* 8.3* 8.3* 8.2*  MG  --  3.4* 3.5* 3.8*  PHOS  --  3.1 2.1* 2.5   GFR: Estimated Creatinine Clearance: 59.4 mL/min (by C-G formula based on SCr of 0.81 mg/dL). Liver Function Tests: Recent Labs  Lab 10-11-19 1354 05/21/20 0250 05/22/20 0213 05/23/20 0252  AST 1,074* 989* 373* 170*  ALT 501* 481* 377* 283*  ALKPHOS 70 64 70 66  BILITOT 1.2 0.9 0.8 0.8  PROT 8.0 7.3 7.5 6.8  ALBUMIN 3.5 3.4* 3.2* 3.1*   No results for input(s): LIPASE, AMYLASE in the last 168 hours. No results for input(s): AMMONIA in the last 168 hours. Coagulation Profile: Recent Labs  Lab 10-11-19 1208  INR 1.3*   Cardiac Enzymes: No results for input(s): CKTOTAL, CKMB,  CKMBINDEX, TROPONINI in the last 168 hours. BNP (last 3 results) No results for input(s): PROBNP in the last 8760 hours. HbA1C: Recent Labs    05/22/20 0726  HGBA1C 6.6*   CBG: Recent Labs  Lab 05/23/20 0758 05/23/20 1210 05/23/20 1612 05/23/20 2157 05/24/20 0800  GLUCAP 138* 268* 215* 188* 175*   Lipid Profile: No results for input(s): CHOL, HDL, LDLCALC, TRIG, CHOLHDL, LDLDIRECT in the last 72 hours. Thyroid Function Tests: No results for input(s): TSH, T4TOTAL, FREET4, T3FREE, THYROIDAB in the last 72 hours. Anemia Panel: Recent Labs    05/22/20 0213 05/23/20 0252  FERRITIN 2,156* 1,095*   Urine analysis:    Component Value Date/Time   COLORURINE YELLOW 02/21/2017 1133   APPEARANCEUR HAZY (A) 02/21/2017 1133  LABSPEC 1.025 02/21/2017 1133   PHURINE 5.0 02/21/2017 1133   GLUCOSEU NEGATIVE 02/21/2017 1133   HGBUR NEGATIVE 02/21/2017 1133   BILIRUBINUR NEGATIVE 02/21/2017 1133   KETONESUR NEGATIVE 02/21/2017 1133   PROTEINUR NEGATIVE 02/21/2017 1133   UROBILINOGEN 0.2 02/28/2010 1254   NITRITE NEGATIVE 02/21/2017 1133   LEUKOCYTESUR LARGE (A) 02/21/2017 1133   Sepsis Labs: Invalid input(s): PROCALCITONIN, LACTICIDVEN  Recent Results (from the past 240 hour(s))  Blood Culture (routine x 2)     Status: None (Preliminary result)   Collection Time: 04/29/2020 12:08 PM   Specimen: BLOOD RIGHT ARM  Result Value Ref Range Status   Specimen Description BLOOD RIGHT ARM DRAWN BY RN  Final   Special Requests   Final    BOTTLES DRAWN AEROBIC AND ANAEROBIC Blood Culture adequate volume   Culture   Final    NO GROWTH 3 DAYS Performed at [facility], [address], [place], [place] [address]    Report Status PENDING  Incomplete  Blood Culture (routine x 2)     Status: None (Preliminary result)   Collection Time: 05/22/2020 12:08 PM   Specimen: BLOOD LEFT ARM  Result Value Ref Range Status   Specimen Description BLOOD LEFT ARM DRAWN BY RN  Final   Special Requests    Final    BOTTLES DRAWN AEROBIC AND ANAEROBIC Blood Culture results may not be optimal due to an excessive volume of blood received in culture bottles   Culture   Final    NO GROWTH 3 DAYS Performed at [facility], [address] [place], [place] [address]    Report Status PENDING  Incomplete  Resp Panel by RT-PCR (Flu A&B, Covid) Nasopharyngeal Swab     Status: Abnormal   Collection Time: 05/09/2020 12:20 PM   Specimen: Nasopharyngeal Swab; Nasopharyngeal(NP) swabs in vial transport medium  Result Value Ref Range Status   SARS Coronavirus 2 by RT PCR POSITIVE (A) NEGATIVE Final    Comment: RESULT CALLED TO, READ BACK BY AND VERIFIED WITH: MARTIN D. AT [id number] BY THOMPSON S. (NOTE) SARS-CoV-2 target nucleic acids are DETECTED.  The SARS-CoV-2 RNA is generally detectable in upper respiratory specimens during the acute phase of infection. Positive results are indicative of the presence of the identified virus, but do not rule out bacterial infection or co-infection with other pathogens not detected by the test. Clinical correlation with patient history and other diagnostic information is necessary to determine patient infection status. The expected result is Negative.  Fact Sheet for Patients: BloggerCourse.com  Fact Sheet for Healthcare Providers: SeriousBroker.it  This test is not yet approved or cleared by the [place] FDA and  has been authorized for detection and/or diagnosis of SARS-CoV-2 by FDA under an Emergency Use Authorization (EUA).  This EUA will remain in effect (meaning this test  can be used) for the duration of  the COVID-19 declaration under Section 564(b)(1) of the Act, 21 U.S.C. section 360bbb-3(b)(1), unless the authorization is terminated or revoked sooner.     Influenza A by PCR NEGATIVE NEGATIVE Final   Influenza B by PCR NEGATIVE NEGATIVE Final    Comment: (NOTE) The Xpert Xpress  SARS-CoV-2/FLU/RSV plus assay is intended as an aid in the diagnosis of influenza from Nasopharyngeal swab specimens and should not be used as a sole basis for treatment. Nasal washings and aspirates are unacceptable for Xpert Xpress SARS-CoV-2/FLU/RSV testing.  Fact Sheet for Patients: BloggerCourse.com  Fact Sheet for Healthcare Providers: SeriousBroker.it  This test is not  yet approved or cleared by the [place] and has been authorized for detection and/or diagnosis of SARS-CoV-2 by FDA under an Emergency Use Authorization (EUA). This EUA will remain in effect (meaning this test can be used) for the duration of the COVID-19 declaration under Section 564(b)(1) of the Act, 21 U.S.C. section 360bbb-3(b)(1), unless the authorization is terminated or revoked.  Performed at [facility], [address] [place], [place] [address]   MRSA PCR Screening     Status: None   Collection Time: 06/02/20  7:12 PM   Specimen: Nasal Mucosa; Nasopharyngeal  Result Value Ref Range Status   MRSA by PCR NEGATIVE NEGATIVE Final    Comment:        The GeneXpert MRSA Assay (FDA approved for NASAL specimens only), is one component of a comprehensive MRSA colonization surveillance program. It is not intended to diagnose MRSA infection nor to guide or monitor treatment for MRSA infections. Performed at [facility], [address], [place], [place] [address]       Radiology Studies: No results found.  Pamella Pert, MD, PhD [facility]  Between 7 am - 7 pm I am available, please contact me via Amion or Securechat  Between 7 pm - 7 am I am not available, please contact night coverage MD/APP via Amion

## 2020-05-24 NOTE — TOC Progression Note (Signed)
Transition of Care [facility]) - Progression Note    Patient Details  Name: KHLOE HUNKELE MRN: [id number] Date of Birth: [birth year]  Transition of Care [facility]) CM/SW Contact  Golda Acre, RN Phone Number: 05/24/2020, 8:27 AM  Clinical Narrative:    hfnc at 45l/min, iv levaquin, iv remdesivir. Plan: to return to home Following for progression.   Expected Discharge Plan: Home/Self Care Barriers to Discharge: No Barriers Identified  Expected Discharge Plan and Services Expected Discharge Plan: Home/Self Care   Discharge Planning Services: CM Consult   Living arrangements for the past 2 months: Single Family Home                                       Social Determinants of Health (SDOH) Interventions    Readmission Risk Interventions No flowsheet data found.

## 2020-05-24 NOTE — Progress Notes (Signed)
Bilateral lower extremity venous duplex has been completed. Preliminary results can be found in CV Proc through chart review.   05/24/20 10:28 AM Olen Cordial RVT

## 2020-05-25 DIAGNOSIS — R7401 Elevation of levels of liver transaminase levels: Secondary | ICD-10-CM | POA: Diagnosis not present

## 2020-05-25 DIAGNOSIS — U071 COVID-19: Secondary | ICD-10-CM | POA: Diagnosis not present

## 2020-05-25 DIAGNOSIS — E875 Hyperkalemia: Secondary | ICD-10-CM | POA: Diagnosis not present

## 2020-05-25 DIAGNOSIS — N179 Acute kidney failure, unspecified: Secondary | ICD-10-CM | POA: Diagnosis not present

## 2020-05-25 LAB — COMPREHENSIVE METABOLIC PANEL
ALT: 164 U/L — ABNORMAL HIGH (ref 0–44)
AST: 57 U/L — ABNORMAL HIGH (ref 15–41)
Albumin: 3.3 g/dL — ABNORMAL LOW (ref 3.5–5.0)
Alkaline Phosphatase: 61 U/L (ref 38–126)
Anion gap: 12 (ref 5–15)
BUN: 29 mg/dL — ABNORMAL HIGH (ref 8–23)
CO2: 27 mmol/L (ref 22–32)
Calcium: 8.3 mg/dL — ABNORMAL LOW (ref 8.9–10.3)
Chloride: 106 mmol/L (ref 98–111)
Creatinine, Ser: 0.89 mg/dL (ref 0.44–1.00)
GFR, Estimated: >60 mL/min (ref >60)
Glucose, Bld: 142 mg/dL — ABNORMAL HIGH (ref 70–99)
Potassium: 4.5 mmol/L (ref 3.5–5.1)
Sodium: 145 mmol/L (ref 135–145)
Total Bilirubin: 0.9 mg/dL (ref 0.3–1.2)
Total Protein: 6.9 g/dL (ref 6.5–8.1)

## 2020-05-25 LAB — CBC WITH DIFFERENTIAL/PLATELET
Abs Immature Granulocytes: 0.44 10*3/uL — ABNORMAL HIGH (ref 0.00–0.07)
Basophils Absolute: 0 10*3/uL (ref 0.0–0.1)
Basophils Relative: 0 %
Eosinophils Absolute: 0 10*3/uL (ref 0.0–0.5)
Eosinophils Relative: 0 %
HCT: 48.5 % — ABNORMAL HIGH (ref 36.0–46.0)
Hemoglobin: 15 g/dL (ref 12.0–15.0)
Immature Granulocytes: 4 %
Lymphocytes Relative: 13 %
Lymphs Abs: 1.5 10*3/uL (ref 0.7–4.0)
MCH: 26.7 pg (ref 26.0–34.0)
MCHC: 30.9 g/dL (ref 30.0–36.0)
MCV: 86.3 fL (ref 80.0–100.0)
Monocytes Absolute: 0.4 10*3/uL (ref 0.1–1.0)
Monocytes Relative: 3 %
Neutro Abs: 8.8 10*3/uL — ABNORMAL HIGH (ref 1.7–7.7)
Neutrophils Relative %: 80 %
Platelets: 195 10*3/uL (ref 150–400)
RBC: 5.62 MIL/uL — ABNORMAL HIGH (ref 3.87–5.11)
RDW: 15.6 % — ABNORMAL HIGH (ref 11.5–15.5)
WBC: 11.1 10*3/uL — ABNORMAL HIGH (ref 4.0–10.5)
nRBC: 0.4 % — ABNORMAL HIGH (ref 0.0–0.2)

## 2020-05-25 LAB — BLOOD GAS, ARTERIAL
Acid-Base Excess: 3.9 mmol/L — ABNORMAL HIGH (ref 0.0–2.0)
Bicarbonate: 26.9 mmol/L (ref 20.0–28.0)
Drawn by: 29503
FIO2: 100
O2 Content: 60 L/min
O2 Saturation: 87.9 %
Patient temperature: 98.6
pCO2 arterial: 36.6 mmHg (ref 32.0–48.0)
pH, Arterial: 7.48 — ABNORMAL HIGH (ref 7.350–7.450)
pO2, Arterial: 59.2 mmHg — ABNORMAL LOW (ref 83.0–108.0)

## 2020-05-25 LAB — PHOSPHORUS: Phosphorus: 3.2 mg/dL (ref 2.5–4.6)

## 2020-05-25 LAB — GLUCOSE, CAPILLARY
Glucose-Capillary: 101 mg/dL — ABNORMAL HIGH (ref 70–99)
Glucose-Capillary: 247 mg/dL — ABNORMAL HIGH (ref 70–99)
Glucose-Capillary: 236 mg/dL — ABNORMAL HIGH (ref 70–99)
Glucose-Capillary: 192 mg/dL — ABNORMAL HIGH (ref 70–99)

## 2020-05-25 LAB — CULTURE, BLOOD (ROUTINE X 2)
Culture: NO GROWTH
Culture: NO GROWTH
Special Requests: ADEQUATE

## 2020-05-25 LAB — MAGNESIUM: Magnesium: 3.4 mg/dL — ABNORMAL HIGH (ref 1.7–2.4)

## 2020-05-25 LAB — D-DIMER, QUANTITATIVE: D-Dimer, Quant: 6.89 ug/mL-FEU — ABNORMAL HIGH (ref 0.00–0.50)

## 2020-05-25 LAB — FERRITIN: Ferritin: 619 ng/mL — ABNORMAL HIGH (ref 11–307)

## 2020-05-25 LAB — C-REACTIVE PROTEIN: CRP: 2.8 mg/dL — ABNORMAL HIGH (ref <1.0)

## 2020-05-25 MED ORDER — SENNOSIDES-DOCUSATE SODIUM 8.6-50 MG PO TABS
2.0000 | ORAL_TABLET | Freq: Two times a day (BID) | ORAL | Status: DC
  Administered 2020-05-25 – 2020-06-08 (×22): 2 via ORAL
  Filled 2020-05-25 (×25): qty 2

## 2020-05-25 MED ORDER — FUROSEMIDE 10 MG/ML IJ SOLN
40.0000 mg | Freq: Once | INTRAMUSCULAR | Status: AC
  Administered 2020-05-25: 40 mg via INTRAVENOUS
  Filled 2020-05-25: qty 4

## 2020-05-25 MED ORDER — CALCIUM CARBONATE ANTACID 500 MG PO CHEW
1.0000 | CHEWABLE_TABLET | Freq: Four times a day (QID) | ORAL | Status: DC | PRN
  Administered 2020-05-25: 200 mg via ORAL
  Filled 2020-05-25: qty 1

## 2020-05-25 MED ORDER — METHYLPREDNISOLONE SODIUM SUCC 125 MG IJ SOLR
80.0000 mg | Freq: Two times a day (BID) | INTRAMUSCULAR | Status: DC
  Administered 2020-05-25 – 2020-05-31 (×14): 80 mg via INTRAVENOUS
  Filled 2020-05-25 (×14): qty 2

## 2020-05-25 NOTE — Progress Notes (Signed)
PROGRESS NOTE  Teresa Gillespie KDT:[id number] DOB: Dec 03, 1945 DOA: 05/14/2020 PCP: Benita Stabile, MD   LOS: 5 days   Brief Narrative / Interim history: 74 year old female with history of essential hypertension but no other significant medical problems, comes into the hospital with about a week long viral illness with myalgias, cough, fevers, as well as progressive shortness of breath.  She apparently started feeling really bad after attending a funeral on 11/23.  She came initially to [facility] when she was found to have sats in the 50s on room air requiring heated high flow oxygen.  Chest x-ray showed bilateral patchy opacities.  She was also found to be in renal failure as well as have acute liver injury  Subjective / 24h Interval events: -Shortness of breath is the main thing.  Especially with minimal activity.  No chest pain, no abdominal pain, no nausea or vomiting.  Assessment & Plan:  Principal Problem Acute Hypoxic Respiratory Failure due to Covid-19 Viral Illness, ARDS -Overall appears comfortable this morning -OK with mild hypoxemia, goal at rest is > 85% SaO2, with movement ideally > 75% -encouraged the patient to sit up in chair in the daytime use I-S and flutter valve for pulmonary toiletry and then prone in bed when at night. -Continue steroids, due to severity maintain IV -Continue remdesivir, plan to finish on 12/1 -Completed 5 days of Levaquin for elevated procalcitonin on admission -Continue baricitinib -Remains profoundly hypoxic requiring 100% FiO2 and 60 L. -D-dimer slightly on the high side, lower extremity ultrasound negative for DVT.  CT angio has been ordered but she needs to be on less oxygen so hold for now  FiO2 (%):  [100 %] 100 %   COVID-19 Labs  Recent Labs    05/23/20 0252 05/24/20 0846 05/24/20 1034 05/25/20 0333  DDIMER 3.68* 5.77*  --  6.89*  FERRITIN 1,095*  --  779* 619*  CRP 8.6*  --  3.5* 2.8*    Lab Results  Component  Value Date   SARSCOV2NAA POSITIVE (A) 05/18/2020    Active Problems Acute kidney injury -Likely multifactorial in the setting of poor p.o. intake prior to admission, probable prolonged hypoxia, creatinine 2.79 in the ED  -Renal ultrasound unremarkable -Creatinine has normalized with IV fluids.  She is 3 L, will give a dose of Lasix today for her ARDS  Hypernatremia -Sodium has now normalized  Acute liver injury -Secondary to COVID-19 infection, prolonged hypoxia, liver ultrasound with evidence of steatosis but no other acute findings.  She has a prior cholecystectomy without biliary dilatation. 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 -Continue sliding scale  CBG (last 3)  Recent Labs    05/24/20 1609 05/24/20 2227 05/25/20 0805  GLUCAP 260* 173* 101*     Scheduled Meds: . amLODipine  5 mg Oral Daily  . baricitinib  4 mg Oral Daily  . Chlorhexidine Gluconate Cloth  6 each Topical Daily  . enoxaparin (LOVENOX) injection  40 mg Subcutaneous Daily  . insulin aspart  0-9 Units Subcutaneous TID WC  . mouth rinse  15 mL Mouth Rinse BID  . predniSONE  50 mg Oral Daily  . saline  1 application Each Nare Q6H   Continuous Infusions: . remdesivir 100 mg in NS 100 mL Stopped (05/24/20 1041)   PRN Meds:.albuterol, lip balm, ondansetron **OR** ondansetron (ZOFRAN) IV  DVT prophylaxis: Lovenox Code Status:  Full code Family Communication: Daughter Alvis Lemmings [phone]  Status is: Inpatient  Remains inpatient appropriate because:Inpatient level of care appropriate due to severity of illness  Dispo: The patient is from: Home              Anticipated d/c is to: Home              Anticipated d/c date is: > 3 days              Patient currently is not medically stable to d/c.   Consultants:  None   Procedures:   None   Microbiology: None   Antibacterials: Levaquin 11/25>>11/29  Objective: Vitals:   05/25/20 0438 05/25/20 0500 05/25/20 0600 05/25/20 0800  BP: (!) 154/66 (!) 147/69 (!) 142/73   Pulse: 69 77 75   Resp: (!) 22 (!) 25 (!) 25   Temp:    98.8 F (37.1 C)  TempSrc:    Axillary  SpO2: (!) 85% (!) 86% 90% (!) 88%  Weight:      Height:        Intake/Output Summary (Last 24 hours) at 05/25/2020 0945 Last data filed at 05/24/2020 1512 Gross per 24 hour  Intake 100 ml  Output --  Net 100 ml   Filed Weights   2020/05/27 1900  Weight: 85.9 kg    Examination: Constitutional: in bed, seems to be in good spirits Eyes: No scleral icterus ENMT: Moist mucous membranes Neck: normal, supple Respiratory: Bibasilar rhonchi, no wheezing, increased respiratory effort, tachypneic Cardiovascular: Regular rate and rhythm, no murmurs, trace edema Abdomen: Soft, nontender, nondistended, bowel sounds positive Musculoskeletal: no clubbing / cyanosis.  Skin: No rashes appreciated Neurologic: No focal deficits, equal strength in all 4 extremities   Data Reviewed: I have independently reviewed following labs and imaging studies   CBC: Recent Labs  Lab 05/21/20 0250 05/22/20 0213 05/23/20 0839 05/24/20 1034 05/25/20 0333  WBC 10.7* 16.1* 12.9* 13.3* 11.1*  NEUTROABS 8.7* 14.0* 10.7* 10.8* 8.8*  HGB 14.5 14.8 15.8* 15.0 15.0  HCT 47.1* 46.7* 50.5* 48.6* 48.5*  MCV 88.4 85.5 85.3 86.9 86.3  PLT 177 206 210 219 195   Basic Metabolic Panel: Recent Labs  Lab 05/21/20 0250 05/22/20 0213 05/23/20 0252 05/24/20 1034 05/25/20 0333  NA 145 150* 151* 147* 145  K 4.1 4.0 4.3 4.0 4.5  CL 106 110 114* 108 106  CO2 25 27 24 26 27   GLUCOSE 170* 184* 151* 193* 142*  BUN 57* 36* 33* 31* 29*  CREATININE 1.64* 0.93 0.81 0.94 0.89  CALCIUM 8.3* 8.3* 8.2* 8.4* 8.3*  MG 3.4* 3.5* 3.8* 3.4* 3.4*  PHOS 3.1 2.1* 2.5 2.5 3.2   GFR: Estimated Creatinine Clearance: 54 mL/min (by C-G formula  based on SCr of 0.89 mg/dL). Liver Function Tests: Recent Labs  Lab 05/21/20 0250 05/22/20 0213 05/23/20 0252 05/24/20 1034 05/25/20 0333  AST 989* 373* 170* 72* 57*  ALT 481* 377* 283* 210* 164*  ALKPHOS 64 70 66 65 61  BILITOT 0.9 0.8 0.8 0.8 0.9  PROT 7.3 7.5 6.8 7.0 6.9  ALBUMIN 3.4* 3.2* 3.1* 3.2* 3.3*   No results for input(s): LIPASE, AMYLASE in the last 168 hours. No results for input(s): AMMONIA in the last 168 hours. Coagulation Profile: Recent Labs  Lab 2020/05/27 1208  INR 1.3*   Cardiac Enzymes: No results for input(s): CKTOTAL, CKMB, CKMBINDEX, TROPONINI in the last 168 hours. BNP (last 3 results) No results for input(s): PROBNP in the last 8760 hours.  HbA1C: No results for input(s): HGBA1C in the last 72 hours. CBG: Recent Labs  Lab 05/24/20 0800 05/24/20 1320 05/24/20 1609 05/24/20 2227 05/25/20 0805  GLUCAP 175* 139* 260* 173* 101*   Lipid Profile: No results for input(s): CHOL, HDL, LDLCALC, TRIG, CHOLHDL, LDLDIRECT in the last 72 hours. Thyroid Function Tests: No results for input(s): TSH, T4TOTAL, FREET4, T3FREE, THYROIDAB in the last 72 hours. Anemia Panel: Recent Labs    05/24/20 1034 05/25/20 0333  FERRITIN 779* 619*   Urine analysis:    Component Value Date/Time   COLORURINE YELLOW 02/21/2017 1133   APPEARANCEUR HAZY (A) 02/21/2017 1133   LABSPEC 1.025 02/21/2017 1133   PHURINE 5.0 02/21/2017 1133   GLUCOSEU NEGATIVE 02/21/2017 1133   HGBUR NEGATIVE 02/21/2017 1133   BILIRUBINUR NEGATIVE 02/21/2017 1133   KETONESUR NEGATIVE 02/21/2017 1133   PROTEINUR NEGATIVE 02/21/2017 1133   UROBILINOGEN 0.2 02/28/2010 1254   NITRITE NEGATIVE 02/21/2017 1133   LEUKOCYTESUR LARGE (A) 02/21/2017 1133   Sepsis Labs: Invalid input(s): PROCALCITONIN, LACTICIDVEN  Recent Results (from the past 240 hour(s))  Blood Culture (routine x 2)     Status: None (Preliminary result)   Collection Time: 05/22/2020 12:08 PM   Specimen: BLOOD RIGHT ARM   Result Value Ref Range Status   Specimen Description BLOOD RIGHT ARM DRAWN BY RN  Final   Special Requests   Final    BOTTLES DRAWN AEROBIC AND ANAEROBIC Blood Culture adequate volume   Culture   Final    NO GROWTH 4 DAYS Performed at [facility], [address], [place], [place] [address]    Report Status PENDING  Incomplete  Blood Culture (routine x 2)     Status: None (Preliminary result)   Collection Time: 05-22-20 12:08 PM   Specimen: BLOOD LEFT ARM  Result Value Ref Range Status   Specimen Description BLOOD LEFT ARM DRAWN BY RN  Final   Special Requests   Final    BOTTLES DRAWN AEROBIC AND ANAEROBIC Blood Culture results may not be optimal due to an excessive volume of blood received in culture bottles   Culture   Final    NO GROWTH 4 DAYS Performed at [facility], [address] [place], [place] [address]    Report Status PENDING  Incomplete  Resp Panel by RT-PCR (Flu A&B, Covid) Nasopharyngeal Swab     Status: Abnormal   Collection Time: 05/22/2020 12:20 PM   Specimen: Nasopharyngeal Swab; Nasopharyngeal(NP) swabs in vial transport medium  Result Value Ref Range Status   SARS Coronavirus 2 by RT PCR POSITIVE (A) NEGATIVE Final    Comment: RESULT CALLED TO, READ BACK BY AND VERIFIED WITH: MARTIN D. AT [id number] BY THOMPSON S. (NOTE) SARS-CoV-2 target nucleic acids are DETECTED.  The SARS-CoV-2 RNA is generally detectable in upper respiratory specimens during the acute phase of infection. Positive results are indicative of the presence of the identified virus, but do not rule out bacterial infection or co-infection with other pathogens not detected by the test. Clinical correlation with patient history and other diagnostic information is necessary to determine patient infection status. The expected result is Negative.  Fact Sheet for Patients: BloggerCourse.com  Fact Sheet for Healthcare  Providers: SeriousBroker.it  This test is not yet approved or cleared by the [place] FDA and  has been authorized for detection and/or diagnosis of SARS-CoV-2 by FDA under an Emergency Use Authorization (EUA).  This EUA will remain in effect (meaning this test  can be used) for the  duration of  the COVID-19 declaration under Section 564(b)(1) of the Act, 21 U.S.C. section 360bbb-3(b)(1), unless the authorization is terminated or revoked sooner.     Influenza A by PCR NEGATIVE NEGATIVE Final   Influenza B by PCR NEGATIVE NEGATIVE Final    Comment: (NOTE) The Xpert Xpress SARS-CoV-2/FLU/RSV plus assay is intended as an aid in the diagnosis of influenza from Nasopharyngeal swab specimens and should not be used as a sole basis for treatment. Nasal washings and aspirates are unacceptable for Xpert Xpress SARS-CoV-2/FLU/RSV testing.  Fact Sheet for Patients: BloggerCourse.com  Fact Sheet for Healthcare Providers: SeriousBroker.it  This test is not yet approved or cleared by the [place] FDA and has been authorized for detection and/or diagnosis of SARS-CoV-2 by FDA under an Emergency Use Authorization (EUA). This EUA will remain in effect (meaning this test can be used) for the duration of the COVID-19 declaration under Section 564(b)(1) of the Act, 21 U.S.C. section 360bbb-3(b)(1), unless the authorization is terminated or revoked.  Performed at [facility], [address], [place], [place] [address]   MRSA PCR Screening     Status: None   Collection Time: 05/15/2020  7:12 PM   Specimen: Nasal Mucosa; Nasopharyngeal  Result Value Ref Range Status   MRSA by PCR NEGATIVE NEGATIVE Final    Comment:        The GeneXpert MRSA Assay (FDA approved for NASAL specimens only), is one component of a comprehensive MRSA colonization surveillance program. It is not intended to diagnose MRSA infection nor  to guide or monitor treatment for MRSA infections. Performed at [facility], [address] [place], [place] [address]       Radiology Studies: VAS [place] LOWER EXTREMITY VENOUS (DVT)  Result Date: 05/24/2020  Lower Venous DVT Study Indications: Elevated Ddimer.  Risk Factors: COVID 19 positive. Comparison Study: No prior studies. Performing Technologist: Chanda Busing RVT  Examination Guidelines: A complete evaluation includes B-mode imaging, spectral Doppler, color Doppler, and power Doppler as needed of all accessible portions of each vessel. Bilateral testing is considered an integral part of a complete examination. Limited examinations for reoccurring indications may be performed as noted. The reflux portion of the exam is performed with the patient in reverse Trendelenburg.  +---------+---------------+---------+-----------+----------+--------------+ RIGHT    CompressibilityPhasicitySpontaneityPropertiesThrombus Aging +---------+---------------+---------+-----------+----------+--------------+ CFV      Full           Yes      Yes                                 +---------+---------------+---------+-----------+----------+--------------+ SFJ      Full                                                        +---------+---------------+---------+-----------+----------+--------------+ FV Prox  Full                                                        +---------+---------------+---------+-----------+----------+--------------+ FV Mid   Full                                                        +---------+---------------+---------+-----------+----------+--------------+  FV DistalFull                                                        +---------+---------------+---------+-----------+----------+--------------+ PFV      Full                                                         +---------+---------------+---------+-----------+----------+--------------+ POP      Full           Yes      Yes                                 +---------+---------------+---------+-----------+----------+--------------+ PTV      Full                                                        +---------+---------------+---------+-----------+----------+--------------+ PERO     Full                                                        +---------+---------------+---------+-----------+----------+--------------+   +---------+---------------+---------+-----------+----------+--------------+ LEFT     CompressibilityPhasicitySpontaneityPropertiesThrombus Aging +---------+---------------+---------+-----------+----------+--------------+ CFV      Full           Yes      Yes                                 +---------+---------------+---------+-----------+----------+--------------+ SFJ      Full                                                        +---------+---------------+---------+-----------+----------+--------------+ FV Prox  Full                                                        +---------+---------------+---------+-----------+----------+--------------+ FV Mid   Full                                                        +---------+---------------+---------+-----------+----------+--------------+ FV DistalFull                                                        +---------+---------------+---------+-----------+----------+--------------+  PFV      Full                                                        +---------+---------------+---------+-----------+----------+--------------+ POP      Full           Yes      Yes                                 +---------+---------------+---------+-----------+----------+--------------+ PTV      Full                                                         +---------+---------------+---------+-----------+----------+--------------+ PERO     Full                                                        +---------+---------------+---------+-----------+----------+--------------+     Summary: RIGHT: - There is no evidence of deep vein thrombosis in the lower extremity.  - No cystic structure found in the popliteal fossa.  LEFT: - There is no evidence of deep vein thrombosis in the lower extremity.  - No cystic structure found in the popliteal fossa.  *See table(s) above for measurements and observations. Electronically signed by Fabienne Brunsharles Fields MD on 05/24/2020 at 3:57:05 PM.    Final     Pamella Pertostin Codee Bloodworth, MD, PhD [facility]  Between 7 am - 7 pm I am available, please contact me via Amion or Securechat  Between 7 pm - 7 am I am not available, please contact night coverage MD/APP via Amion

## 2020-05-26 DIAGNOSIS — R7401 Elevation of levels of liver transaminase levels: Secondary | ICD-10-CM | POA: Diagnosis not present

## 2020-05-26 DIAGNOSIS — J9600 Acute respiratory failure, unspecified whether with hypoxia or hypercapnia: Secondary | ICD-10-CM

## 2020-05-26 DIAGNOSIS — U071 COVID-19: Principal | ICD-10-CM

## 2020-05-26 DIAGNOSIS — E875 Hyperkalemia: Secondary | ICD-10-CM | POA: Diagnosis not present

## 2020-05-26 DIAGNOSIS — N179 Acute kidney failure, unspecified: Secondary | ICD-10-CM | POA: Diagnosis not present

## 2020-05-26 LAB — GLUCOSE, CAPILLARY
Glucose-Capillary: 180 mg/dL — ABNORMAL HIGH (ref 70–99)
Glucose-Capillary: 190 mg/dL — ABNORMAL HIGH (ref 70–99)
Glucose-Capillary: 237 mg/dL — ABNORMAL HIGH (ref 70–99)
Glucose-Capillary: 262 mg/dL — ABNORMAL HIGH (ref 70–99)
Glucose-Capillary: 198 mg/dL — ABNORMAL HIGH (ref 70–99)

## 2020-05-26 LAB — CBC
HCT: 40.2 % (ref 36.0–46.0)
Hemoglobin: 12.8 g/dL (ref 12.0–15.0)
MCH: 26.9 pg (ref 26.0–34.0)
MCHC: 31.8 g/dL (ref 30.0–36.0)
MCV: 84.5 fL (ref 80.0–100.0)
Platelets: 190 10*3/uL (ref 150–400)
RBC: 4.76 MIL/uL (ref 3.87–5.11)
RDW: 15.1 % (ref 11.5–15.5)
WBC: 13.5 10*3/uL — ABNORMAL HIGH (ref 4.0–10.5)
nRBC: 0.1 % (ref 0.0–0.2)

## 2020-05-26 LAB — COMPREHENSIVE METABOLIC PANEL
ALT: 119 U/L — ABNORMAL HIGH (ref 0–44)
AST: 43 U/L — ABNORMAL HIGH (ref 15–41)
Albumin: 3 g/dL — ABNORMAL LOW (ref 3.5–5.0)
Alkaline Phosphatase: 57 U/L (ref 38–126)
Anion gap: 13 (ref 5–15)
BUN: 27 mg/dL — ABNORMAL HIGH (ref 8–23)
CO2: 29 mmol/L (ref 22–32)
Calcium: 8 mg/dL — ABNORMAL LOW (ref 8.9–10.3)
Chloride: 100 mmol/L (ref 98–111)
Creatinine, Ser: 0.55 mg/dL (ref 0.44–1.00)
GFR, Estimated: >60 mL/min (ref >60)
Glucose, Bld: 190 mg/dL — ABNORMAL HIGH (ref 70–99)
Potassium: 4.9 mmol/L (ref 3.5–5.1)
Sodium: 142 mmol/L (ref 135–145)
Total Bilirubin: 0.3 mg/dL (ref 0.3–1.2)
Total Protein: 6.6 g/dL (ref 6.5–8.1)

## 2020-05-26 LAB — MAGNESIUM: Magnesium: 2.8 mg/dL — ABNORMAL HIGH (ref 1.7–2.4)

## 2020-05-26 LAB — D-DIMER, QUANTITATIVE: D-Dimer, Quant: 5.38 ug/mL-FEU — ABNORMAL HIGH (ref 0.00–0.50)

## 2020-05-26 LAB — C-REACTIVE PROTEIN: CRP: 10.7 mg/dL — ABNORMAL HIGH (ref <1.0)

## 2020-05-26 MED ORDER — FUROSEMIDE 10 MG/ML IJ SOLN
40.0000 mg | Freq: Once | INTRAMUSCULAR | Status: AC
  Administered 2020-05-26: 40 mg via INTRAVENOUS
  Filled 2020-05-26: qty 4

## 2020-05-26 MED ORDER — ENOXAPARIN SODIUM 40 MG/0.4ML ~~LOC~~ SOLN
40.0000 mg | Freq: Two times a day (BID) | SUBCUTANEOUS | Status: DC
  Administered 2020-05-26 – 2020-06-07 (×26): 40 mg via SUBCUTANEOUS
  Filled 2020-05-26 (×26): qty 0.4

## 2020-05-26 MED ORDER — INSULIN ASPART 100 UNIT/ML ~~LOC~~ SOLN
0.0000 [IU] | SUBCUTANEOUS | Status: DC
  Administered 2020-05-26 – 2020-05-27 (×2): 3 [IU] via SUBCUTANEOUS
  Administered 2020-05-27 (×2): 5 [IU] via SUBCUTANEOUS
  Administered 2020-05-27: 8 [IU] via SUBCUTANEOUS
  Administered 2020-05-27 – 2020-05-29 (×8): 3 [IU] via SUBCUTANEOUS
  Administered 2020-05-29 (×2): 5 [IU] via SUBCUTANEOUS
  Administered 2020-05-29 – 2020-05-30 (×4): 3 [IU] via SUBCUTANEOUS
  Administered 2020-05-30: 5 [IU] via SUBCUTANEOUS
  Administered 2020-05-30 (×3): 2 [IU] via SUBCUTANEOUS
  Administered 2020-05-31 (×2): 3 [IU] via SUBCUTANEOUS
  Administered 2020-05-31 – 2020-06-01 (×5): 2 [IU] via SUBCUTANEOUS
  Administered 2020-06-01: 3 [IU] via SUBCUTANEOUS
  Administered 2020-06-01: 2 [IU] via SUBCUTANEOUS
  Administered 2020-06-01 – 2020-06-02 (×2): 3 [IU] via SUBCUTANEOUS
  Administered 2020-06-02: 2 [IU] via SUBCUTANEOUS
  Administered 2020-06-03: 3 [IU] via SUBCUTANEOUS
  Administered 2020-06-03 – 2020-06-04 (×3): 2 [IU] via SUBCUTANEOUS
  Administered 2020-06-04: 3 [IU] via SUBCUTANEOUS
  Administered 2020-06-04: 2 [IU] via SUBCUTANEOUS
  Administered 2020-06-04: 3 [IU] via SUBCUTANEOUS
  Administered 2020-06-04: 5 [IU] via SUBCUTANEOUS
  Administered 2020-06-05 (×2): 3 [IU] via SUBCUTANEOUS
  Administered 2020-06-05 (×3): 2 [IU] via SUBCUTANEOUS
  Administered 2020-06-06: 17:00:00 3 [IU] via SUBCUTANEOUS
  Administered 2020-06-06: 20:00:00 2 [IU] via SUBCUTANEOUS
  Administered 2020-06-06: 12:00:00 3 [IU] via SUBCUTANEOUS
  Administered 2020-06-07: 13:00:00 5 [IU] via SUBCUTANEOUS
  Administered 2020-06-07 – 2020-06-08 (×5): 2 [IU] via SUBCUTANEOUS
  Administered 2020-06-08: 13:00:00 3 [IU] via SUBCUTANEOUS
  Administered 2020-06-08: 06:00:00 2 [IU] via SUBCUTANEOUS

## 2020-05-26 NOTE — Consult Note (Signed)
NAME:  Teresa Gillespie, MRN:  [id number], DOB:  [birth year], LOS: 6 ADMISSION DATE:  05/13/2020, CONSULTATION DATE:  05/26/2020 REFERRING MD:  Tyrone Nine, MD, CHIEF COMPLAINT:  Respiratory failure   History of present illness   Teresa Gillespie is a 74 y.o. woman with a past medical history of hypertension and obesity presents with respiratory failure.  She presented to [facility] emergency department on 11/25 with dyspnea of 2 days duration.  She fell 6 on 11/23 after attending a funeral.  Apparently all of her other family members were also positive for COVID-19.  Given her profound hypoxemia saturations 80% on room air at that time he was transferred to [facility] and admitted to the progressive care unit requiring heated high flow.  Over the past 6 days she is been treated with remdesivir baricitinib and steroids.  She had some initial acute kidney injury and acute liver injury which has now improved.  Her oxygen requirements have progressively increased.  Hospital medicine consulting for worsening respiratory failure with concern for impending intubation.  Past Medical History  HTN, Obesity  Significant Hospital Events   12/1 O2 requirements increased, upgraded to ICU level of care  Consults:  PCCM  Procedures:    Significant Diagnostic Tests:    Micro Data:  11/25 covid 19 positive  Antimicrobials:  Levofloxacin   Interim history/subjective:  This morning she is not tachypneic and says she feels fine.  She is on 70 L/min heated high flow plus nonrebreather.  She has been eating but needing to put the nonrebreather on in between bites.  Objective   Blood pressure (!) 144/66, pulse 61, temperature 98 F (36.7 C), temperature source Axillary, resp. rate (!) 22, height 5' (1.524 m), weight 85.9 kg, SpO2 (!) 85 %.    FiO2 (%):  [100 %] 100 %   Intake/Output Summary (Last 24 hours) at 05/26/2020 1255 Last data filed at 05/26/2020 1014 Gross per 24 hour  Intake  0 ml  Output 600 ml  Net -600 ml   Filed Weights   04/27/2020 1900  Weight: 85.9 kg    Examination: General:  HENT: dry mucus membranes Lungs: diminished, no wheezes or crackles Cardiovascular: RRR, no mrg Abdomen: soft, nontender Extremities: no edema Neuro: AOx3, answers questions appropriately  Resolved Hospital Problem list     Assessment & Plan:   The patient is a 52 albumin with a history of hypertension and obesity who presents with  Acute hypoxemic respiratory failure COVID-19 pneumonia -Worsening hypoxemia now on 70 L/min of heated high flow nasal cannula.  I discussed intubation with her, she wants to live and is agreeable to intubation necessary.  She is currently 91% on her 70 L/min plus nonrebreather.  She is not altered, she is completing full sentences and she is alert and oriented.  I think it is likely that she will decompensate and require intubation sooner rather than later.  We will continue to watch her closely. -She is status post remdesivir, last dose is today.  She is completed empiric 5 days of Levaquin for elevated procalcitonin.  She is still on baricitinib.She is still on steroids as well -Continue therapeutic enoxaparin dosing for presumed VTE.  She has been unable to get CT angio due to hypoxemia.  She has an elevated D-dimer.  Best practice (evaluated daily)   Diet: NPO Pain/Anxiety/Delirium protocol (if indicated):  VAP protocol (if indicated): will need once intubated DVT prophylaxis: on lovenox therapeutic GI prophylaxis: will nee  PPI once intubated Glucose control: goal <180 Mobility: bed rest last date of multidisciplinary goals of care discussion 12/1 talked with patient Family and staff present bedside nurse Morrie Sheldon Summary of discussion patient discusses a desire to live.  I explained that if there is any chance she is going to go to the hospital and the ventilator would be medically necessary.  I have conveyed this to her daughter Alvis Lemmings  who will relay it to her other sister and her father.  They understand that there is a high risk of mortality regardless. Follow up goals of care discussion due 12/4 Code Status: Full Disposition: ICU  The patient is critically ill with multiple organ systems failure and requires high complexity decision making for assessment and support, frequent evaluation and titration of therapies, application of advanced monitoring technologies and extensive interpretation of multiple databases.   Critical Care Time devoted to patient care services described in this note is 40 minutes. This time reflects time of care of this signee Charlott Holler . This critical care time does not reflect separately billable procedures or procedure time, teaching time or supervisory time of PA/NP/Med student/Med Resident etc but could involve care discussion time.  Charlott Holler [facility] 05/26/2020 12:55 PM  Pager: [phone] After hours pager: [phone]   Labs   CBC: Recent Labs  Lab 05/21/20 0250 05/21/20 0250 05/22/20 0213 05/23/20 0839 05/24/20 1034 05/25/20 0333 05/26/20 0309  WBC 10.7*   < > 16.1* 12.9* 13.3* 11.1* 13.5*  NEUTROABS 8.7*  --  14.0* 10.7* 10.8* 8.8*  --   HGB 14.5   < > 14.8 15.8* 15.0 15.0 12.8  HCT 47.1*   < > 46.7* 50.5* 48.6* 48.5* 40.2  MCV 88.4   < > 85.5 85.3 86.9 86.3 84.5  PLT 177   < > 206 210 219 195 190   < > = values in this interval not displayed.    Basic Metabolic Panel: Recent Labs  Lab 05/21/20 0250 05/21/20 0250 05/22/20 0213 05/23/20 0252 05/24/20 1034 05/25/20 0333 05/26/20 0309  NA 145   < > 150* 151* 147* 145 142  K 4.1   < > 4.0 4.3 4.0 4.5 4.9  CL 106   < > 110 114* 108 106 100  CO2 25   < > 27 24 26 27 29   GLUCOSE 170*   < > 184* 151* 193* 142* 190*  BUN 57*   < > 36* 33* 31* 29* 27*  CREATININE 1.64*   < > 0.93 0.81 0.94 0.89 0.55  CALCIUM 8.3*   < > 8.3* 8.2* 8.4* 8.3* 8.0*  MG 3.4*   < > 3.5* 3.8* 3.4*  3.4* 2.8*  PHOS 3.1  --  2.1* 2.5 2.5 3.2  --    < > = values in this interval not displayed.   GFR: Estimated Creatinine Clearance: 60.1 mL/min (by C-G formula based on SCr of 0.55 mg/dL). Recent Labs  Lab 05/16/2020 1208 05/10/2020 1354 05/21/20 0250 05/23/20 0839 05/24/20 1034 05/25/20 0333 05/26/20 0309  PROCALCITON  --  1.65  --   --   --   --   --   WBC 9.7  --    < > 12.9* 13.3* 11.1* 13.5*  LATICACIDVEN >11* 8.9*  --   --   --   --   --    < > = values in this interval not displayed.    Liver Function Tests: Recent Labs  Lab  05/22/20 0213 05/23/20 0252 05/24/20 1034 05/25/20 0333 05/26/20 0309  AST 373* 170* 72* 57* 43*  ALT 377* 283* 210* 164* 119*  ALKPHOS 70 66 65 61 57  BILITOT 0.8 0.8 0.8 0.9 0.3  PROT 7.5 6.8 7.0 6.9 6.6  ALBUMIN 3.2* 3.1* 3.2* 3.3* 3.0*   No results for input(s): LIPASE, AMYLASE in the last 168 hours. No results for input(s): AMMONIA in the last 168 hours.  ABG    Component Value Date/Time   PHART 7.480 (H) 05/25/2020 1030   PCO2ART 36.6 05/25/2020 1030   PO2ART 59.2 (L) 05/25/2020 1030   HCO3 26.9 05/25/2020 1030   ACIDBASEDEF 2.4 (H) 05/22/2020 1330   O2SAT 87.9 05/25/2020 1030     Coagulation Profile: Recent Labs  Lab 05/11/2020 1208  INR 1.3*    Cardiac Enzymes: No results for input(s): CKTOTAL, CKMB, CKMBINDEX, TROPONINI in the last 168 hours.  HbA1C: Hgb A1c MFr Bld  Date/Time Value Ref Range Status  05/22/2020 07:26 AM 6.6 (H) 4.8 - 5.6 % Final    Comment:    RESULTS CONFIRMED BY MANUAL DILUTION (NOTE) Pre diabetes:          5.7%-6.4%  Diabetes:              >6.4%  Glycemic control for   <7.0% adults with diabetes     CBG: Recent Labs  Lab 05/25/20 1215 05/25/20 1618 05/25/20 2120 05/26/20 0801 05/26/20 1147  GLUCAP 247* 236* 192* 180* 190*    Review of Systems:   +sob +cough +nasal and mouth dryness Otherwise 10 point ROS reviewed and negative  Past Medical History  She,  has a past medical  history of Hypertension.   Surgical History    Past Surgical History:  Procedure Laterality Date  . ABDOMINAL HYSTERECTOMY    . CHOLECYSTECTOMY    . NECK SURGERY    . TUBAL LIGATION       Social History   reports that she has never smoked. She has never used smokeless tobacco. She reports that she does not drink alcohol and does not use drugs.   Family History   Her family history is not on file.   Allergies Allergies  Allergen Reactions  . Penicillins Hives and Rash     Home Medications  Prior to Admission medications   Medication Sig Start Date End Date Taking? Authorizing Provider  amLODipine (NORVASC) 5 MG tablet Take 1 tablet (5 mg total) by mouth daily. 09/09/16 10/24/16  Johnson, Clanford L, MD  oxyCODONE-acetaminophen (PERCOCET) 5-325 MG tablet Take 1 tablet by mouth every 4 (four) hours as needed for moderate pain. Patient not taking: Reported on 05/21/2020 09/16/17   Dione Booze, MD  oxyCODONE-acetaminophen (PERCOCET) 5-325 MG tablet Take 1 tablet by mouth every 4 (four) hours as needed for moderate pain. Patient not taking: Reported on 05/06/2020 09/16/17   Dione Booze, MD  saccharomyces boulardii (FLORASTOR) 250 MG capsule Take 1 capsule (250 mg total) by mouth 2 (two) times daily. Patient not taking: Reported on 05/25/2020 09/08/16   Cleora Fleet, MD

## 2020-05-26 NOTE — Progress Notes (Signed)
PROGRESS NOTE  Teresa Gillespie  CWC:[id number] DOB: [birth year] DOA: 05/05/2020 PCP: Benita Stabile, MD   Brief Narrative: Teresa Gillespie is a 74 y.o. female with a history of HTN and obesity who fell ill after a funeral on 11/23 and presented to [facility] 11/25 with severe dyspnea saturating 50% on room air. SARS-CoV-2 PCR was positive with CXR showing bilateral patchy opacities. Labs also indicated acute liver injury and acute renal failure. She was admitted to [facility] SDU requiring heated high flow oxygen.  Assessment & Plan: Active Problems:   Acute respiratory failure due to COVID-19 [facility])   AKI (acute kidney injury) (HCC)   Transaminasemia   Hyperkalemia  Acute hypoxemic respiratory failure due to covid-19 pneumonia: SARS-CoV-2 PCR positive on 11/25.  - Remains profoundly hypoxemic, though stable respiratory effort and acceptable pO2 with heated high flow oxygen. Remain in SDU/ICU.  - s/p remdesivir x5 days (11/27 - 12/1) - Continue steroids. CRP was improving, though up 12/1. Has completed 5 days levaquin empirically for elevated PCT on admission.  - Continue baricitinib for now (started 11/28) with improving LFTs and creatinine, low suspicion for DILI. - Encourage OOB, IS, FV, and awake proning if able - Continue airborne, contact precautions for 21 days from positive testing. - Monitor CMP and inflammatory markers - Enoxaparin 40mg  IV q12h with inability to perform CTA chest and elevated d-dimer. Venous LE U/S negative for DVT. Check echocardiogram.  Acute renal failure: Multifactorial related to poor po intake, decreased EABV, and prolonged/severe hypoxia/covid. Renal U/S unremarkable.  - Improved with IVF, creatinine normalized from 2.79 on admission. Lasix given 11/30, will repeat x1 today.   Acute liver injury, hepatic steatosis: Seen on U/S. Hx prior cholecystectomy without biliary dilatation. LFTs improving.  - Continue monitoring LFTs.   T2DM with steroid-induced  hyperglycemia: HbA1c 6.6% indicated mild diabetes.  - SSI  Hyponatremia: Improved.   HTN:  - Continue norvasc, avoid hypotension. BP stable.  Obesity: Estimated body mass index is 36.98 kg/m as calculated from the following:   Height as of this encounter: 5' (1.524 m).   Weight as of this encounter: 85.9 kg.  DVT prophylaxis: Lovenox Code Status: Full Family Communication: Will contact in PM Disposition Plan:  Status is: Inpatient  Remains inpatient appropriate because:Inpatient level of care appropriate due to severity of illness  Dispo: The patient is from: Home              Anticipated d/c is to: TBD              Anticipated d/c date is: > 3 days              Patient currently is not medically stable to d/c.  Consultants:   None  Procedures:   None  Antimicrobials:  Remdesivir 11/27 - 12/1  Levaquin 11/25 - 11/28  Subjective: Shortness of breath is constant, waxing/waning, overall stable, severe with any exertion associated with cough, no chest pain. No leg swelling or abd pain.   Objective: Vitals:   05/26/20 0400 05/26/20 0500 05/26/20 0732 05/26/20 0800  BP: (!) 159/71 (!) 163/77    Pulse: 63 (!) 58    Resp: 14 (!) 21    Temp: 97.7 F (36.5 C)   (!) 97.3 F (36.3 C)  TempSrc: Axillary   Axillary  SpO2: (!) 86% 91% (!) 88%   Weight:      Height:        Intake/Output Summary (Last 24 hours) at 05/26/2020 1026 Last  data filed at 05/26/2020 0200 Gross per 24 hour  Intake 88.96 ml  Output 600 ml  Net -511.04 ml   Filed Weights   06/03/2020 1900  Weight: 85.9 kg    Gen: 74 y.o. female in no distress Pulm: Non-labored tachypnea with bilateral crackles.  CV: Regular borderline bradycardia. No murmur, rub, or gallop. No JVD, no pitting pedal edema. GI: Abdomen soft, non-tender, non-distended, with normoactive bowel sounds. No organomegaly or masses felt. Ext: Warm, no deformities Skin: No rashes, lesions or ulcers Neuro: Alert and oriented. No  focal neurological deficits. Psych: Judgement and insight appear normal. Mood & affect appropriate.   Data Reviewed: I have personally reviewed following labs and imaging studies  CBC: Recent Labs  Lab 05/21/20 0250 05/21/20 0250 05/22/20 0213 05/23/20 0839 05/24/20 1034 05/25/20 0333 05/26/20 0309  WBC 10.7*   < > 16.1* 12.9* 13.3* 11.1* 13.5*  NEUTROABS 8.7*  --  14.0* 10.7* 10.8* 8.8*  --   HGB 14.5   < > 14.8 15.8* 15.0 15.0 12.8  HCT 47.1*   < > 46.7* 50.5* 48.6* 48.5* 40.2  MCV 88.4   < > 85.5 85.3 86.9 86.3 84.5  PLT 177   < > 206 210 219 195 190   < > = values in this interval not displayed.   Basic Metabolic Panel: Recent Labs  Lab 05/21/20 0250 05/21/20 0250 05/22/20 0213 05/23/20 0252 05/24/20 1034 05/25/20 0333 05/26/20 0309  NA 145   < > 150* 151* 147* 145 142  K 4.1   < > 4.0 4.3 4.0 4.5 4.9  CL 106   < > 110 114* 108 106 100  CO2 25   < > 27 24 26 27 29   GLUCOSE 170*   < > 184* 151* 193* 142* 190*  BUN 57*   < > 36* 33* 31* 29* 27*  CREATININE 1.64*   < > 0.93 0.81 0.94 0.89 0.55  CALCIUM 8.3*   < > 8.3* 8.2* 8.4* 8.3* 8.0*  MG 3.4*   < > 3.5* 3.8* 3.4* 3.4* 2.8*  PHOS 3.1  --  2.1* 2.5 2.5 3.2  --    < > = values in this interval not displayed.   GFR: Estimated Creatinine Clearance: 60.1 mL/min (by C-G formula based on SCr of 0.55 mg/dL). Liver Function Tests: Recent Labs  Lab 05/22/20 0213 05/23/20 0252 05/24/20 1034 05/25/20 0333 05/26/20 0309  AST 373* 170* 72* 57* 43*  ALT 377* 283* 210* 164* 119*  ALKPHOS 70 66 65 61 57  BILITOT 0.8 0.8 0.8 0.9 0.3  PROT 7.5 6.8 7.0 6.9 6.6  ALBUMIN 3.2* 3.1* 3.2* 3.3* 3.0*   No results for input(s): LIPASE, AMYLASE in the last 168 hours. No results for input(s): AMMONIA in the last 168 hours. Coagulation Profile: Recent Labs  Lab 06/03/2020 1208  INR 1.3*   Cardiac Enzymes: No results for input(s): CKTOTAL, CKMB, CKMBINDEX, TROPONINI in the last 168 hours. BNP (last 3 results) No results for  input(s): PROBNP in the last 8760 hours. HbA1C: No results for input(s): HGBA1C in the last 72 hours. CBG: Recent Labs  Lab 05/25/20 0805 05/25/20 1215 05/25/20 1618 05/25/20 2120 05/26/20 0801  GLUCAP 101* 247* 236* 192* 180*   Lipid Profile: No results for input(s): CHOL, HDL, LDLCALC, TRIG, CHOLHDL, LDLDIRECT in the last 72 hours. Thyroid Function Tests: No results for input(s): TSH, T4TOTAL, FREET4, T3FREE, THYROIDAB in the last 72 hours. Anemia Panel: Recent Labs    05/24/20 1034  05/25/20 0333  FERRITIN 779* 619*   Urine analysis:    Component Value Date/Time   COLORURINE YELLOW 02/21/2017 1133   APPEARANCEUR HAZY (A) 02/21/2017 1133   LABSPEC 1.025 02/21/2017 1133   PHURINE 5.0 02/21/2017 1133   GLUCOSEU NEGATIVE 02/21/2017 1133   HGBUR NEGATIVE 02/21/2017 1133   BILIRUBINUR NEGATIVE 02/21/2017 1133   KETONESUR NEGATIVE 02/21/2017 1133   PROTEINUR NEGATIVE 02/21/2017 1133   UROBILINOGEN 0.2 02/28/2010 1254   NITRITE NEGATIVE 02/21/2017 1133   LEUKOCYTESUR LARGE (A) 02/21/2017 1133   Recent Results (from the past 240 hour(s))  Blood Culture (routine x 2)     Status: None   Collection Time: 05/19/2020 12:08 PM   Specimen: BLOOD RIGHT ARM  Result Value Ref Range Status   Specimen Description BLOOD RIGHT ARM DRAWN BY RN  Final   Special Requests   Final    BOTTLES DRAWN AEROBIC AND ANAEROBIC Blood Culture adequate volume   Culture   Final    NO GROWTH 5 DAYS Performed at [facility], [address] [place], [place] [address]    Report Status 05/25/2020 FINAL  Final  Blood Culture (routine x 2)     Status: None   Collection Time: 05/15/2020 12:08 PM   Specimen: BLOOD LEFT ARM  Result Value Ref Range Status   Specimen Description BLOOD LEFT ARM DRAWN BY RN  Final   Special Requests   Final    BOTTLES DRAWN AEROBIC AND ANAEROBIC Blood Culture results may not be optimal due to an excessive volume of blood received in culture bottles   Culture   Final    NO  GROWTH 5 DAYS Performed at [facility], [address], [place], [place] [address]    Report Status 05/25/2020 FINAL  Final  Resp Panel by RT-PCR (Flu A&B, Covid) Nasopharyngeal Swab     Status: Abnormal   Collection Time: 05/13/2020 12:20 PM   Specimen: Nasopharyngeal Swab; Nasopharyngeal(NP) swabs in vial transport medium  Result Value Ref Range Status   SARS Coronavirus 2 by RT PCR POSITIVE (A) NEGATIVE Final    Comment: RESULT CALLED TO, READ BACK BY AND VERIFIED WITH: MARTIN D. AT [id number] BY THOMPSON S. (NOTE) SARS-CoV-2 target nucleic acids are DETECTED.  The SARS-CoV-2 RNA is generally detectable in upper respiratory specimens during the acute phase of infection. Positive results are indicative of the presence of the identified virus, but do not rule out bacterial infection or co-infection with other pathogens not detected by the test. Clinical correlation with patient history and other diagnostic information is necessary to determine patient infection status. The expected result is Negative.  Fact Sheet for Patients: BloggerCourse.com  Fact Sheet for Healthcare Providers: SeriousBroker.it  This test is not yet approved or cleared by the [place] FDA and  has been authorized for detection and/or diagnosis of SARS-CoV-2 by FDA under an Emergency Use Authorization (EUA).  This EUA will remain in effect (meaning this test  can be used) for the duration of  the COVID-19 declaration under Section 564(b)(1) of the Act, 21 U.S.C. section 360bbb-3(b)(1), unless the authorization is terminated or revoked sooner.     Influenza A by PCR NEGATIVE NEGATIVE Final   Influenza B by PCR NEGATIVE NEGATIVE Final    Comment: (NOTE) The Xpert Xpress SARS-CoV-2/FLU/RSV plus assay is intended as an aid in the diagnosis of influenza from Nasopharyngeal swab specimens and should not be used as a sole basis for treatment. Nasal washings  and aspirates are unacceptable for  Xpert Xpress SARS-CoV-2/FLU/RSV testing.  Fact Sheet for Patients: BloggerCourse.comhttps://www.fda.gov/media/152166/download  Fact Sheet for Healthcare Providers: SeriousBroker.ithttps://www.fda.gov/media/152162/download  This test is not yet approved or cleared by the [place] FDA and has been authorized for detection and/or diagnosis of SARS-CoV-2 by FDA under an Emergency Use Authorization (EUA). This EUA will remain in effect (meaning this test can be used) for the duration of the COVID-19 declaration under Section 564(b)(1) of the Act, 21 U.S.C. section 360bbb-3(b)(1), unless the authorization is terminated or revoked.  Performed at [facility], [address] [place], [place] [address]   MRSA PCR Screening     Status: None   Collection Time: 07/19/19  7:12 PM   Specimen: Nasal Mucosa; Nasopharyngeal  Result Value Ref Range Status   MRSA by PCR NEGATIVE NEGATIVE Final    Comment:        The GeneXpert MRSA Assay (FDA approved for NASAL specimens only), is one component of a comprehensive MRSA colonization surveillance program. It is not intended to diagnose MRSA infection nor to guide or monitor treatment for MRSA infections. Performed at [facility], [address] [place], [place] [address]       Radiology Studies: VAS US LOWER EXTREMITY VENOUS (DVT)  Result Date: 05/24/2020  Lower Venous DVT Study Indications: Elevated Ddimer.  Risk Factors: COVID 19 positive. Comparison Study: No prior studies. Performing Technologist: Chanda BusingGregory Collins RVT  Examination Guidelines: A complete evaluation includes B-mode imaging, spectral Doppler, color Doppler, and power Doppler as needed of all accessible portions of each vessel. Bilateral testing is considered an integral part of a complete examination. Limited examinations for reoccurring indications may be performed as noted. The reflux portion of the exam is performed with the patient in reverse  Trendelenburg.  +---------+---------------+---------+-----------+----------+--------------+ RIGHT    CompressibilityPhasicitySpontaneityPropertiesThrombus Aging +---------+---------------+---------+-----------+----------+--------------+ CFV      Full           Yes      Yes                                 +---------+---------------+---------+-----------+----------+--------------+ SFJ      Full                                                        +---------+---------------+---------+-----------+----------+--------------+ FV Prox  Full                                                        +---------+---------------+---------+-----------+----------+--------------+ FV Mid   Full                                                        +---------+---------------+---------+-----------+----------+--------------+ FV DistalFull                                                        +---------+---------------+---------+-----------+----------+--------------+  PFV      Full                                                        +---------+---------------+---------+-----------+----------+--------------+ POP      Full           Yes      Yes                                 +---------+---------------+---------+-----------+----------+--------------+ PTV      Full                                                        +---------+---------------+---------+-----------+----------+--------------+ PERO     Full                                                        +---------+---------------+---------+-----------+----------+--------------+   +---------+---------------+---------+-----------+----------+--------------+ LEFT     CompressibilityPhasicitySpontaneityPropertiesThrombus Aging +---------+---------------+---------+-----------+----------+--------------+ CFV      Full           Yes      Yes                                  +---------+---------------+---------+-----------+----------+--------------+ SFJ      Full                                                        +---------+---------------+---------+-----------+----------+--------------+ FV Prox  Full                                                        +---------+---------------+---------+-----------+----------+--------------+ FV Mid   Full                                                        +---------+---------------+---------+-----------+----------+--------------+ FV DistalFull                                                        +---------+---------------+---------+-----------+----------+--------------+ PFV      Full                                                        +---------+---------------+---------+-----------+----------+--------------+   POP      Full           Yes      Yes                                 +---------+---------------+---------+-----------+----------+--------------+ PTV      Full                                                        +---------+---------------+---------+-----------+----------+--------------+ PERO     Full                                                        +---------+---------------+---------+-----------+----------+--------------+     Summary: RIGHT: - There is no evidence of deep vein thrombosis in the lower extremity.  - No cystic structure found in the popliteal fossa.  LEFT: - There is no evidence of deep vein thrombosis in the lower extremity.  - No cystic structure found in the popliteal fossa.  *See table(s) above for measurements and observations. Electronically signed by Fabienne Bruns MD on 05/24/2020 at 3:57:05 PM.    Final     Scheduled Meds: . amLODipine  5 mg Oral Daily  . baricitinib  4 mg Oral Daily  . Chlorhexidine Gluconate Cloth  6 each Topical Daily  . enoxaparin (LOVENOX) injection  40 mg Subcutaneous Q12H  . insulin aspart  0-9 Units Subcutaneous  TID WC  . mouth rinse  15 mL Mouth Rinse BID  . methylPREDNISolone (SOLU-MEDROL) injection  80 mg Intravenous Q12H  . saline  1 application Each Nare Q6H  . senna-docusate  2 tablet Oral BID   Continuous Infusions: . remdesivir 100 mg in NS 100 mL 100 mg (05/26/20 1014)     LOS: 6 days   Time spent: 35 minutes.  Tyrone Nine, MD [facility] [email or web address] 05/26/2020, 10:26 AM

## 2020-05-26 NOTE — TOC Progression Note (Signed)
Transition of Care [facility]) - Progression Note    Patient Details  Name: Teresa Gillespie MRN: [id number] Date of Birth: 1945-08-02  Transition of [facility] CM/SW Contact  Golda Acre, RN Phone Number: 05/26/2020, 7:42 AM  Clinical Narrative:    Assessment & Plan:  Principal Problem Acute Hypoxic Respiratory Failure due to Covid-19 Viral Illness, ARDS -Overall appears comfortable this morning -OK with mild hypoxemia, goal at rest is > 85% SaO2, with movement ideally > 75% -encouraged the patient to sit up in chair in the daytime use I-S and flutter valve for pulmonary toiletry and then prone in bed when at night. -Continue steroids, due to severity maintain IV -Continue remdesivir, plan to finish on 12/1 -Completed 5 days of Levaquin for elevated procalcitonin on admission -Continue baricitinib -Remains profoundly hypoxic requiring 100% FiO2 and 60 L. -D-dimer slightly on the high side, lower extremity ultrasound negative for DVT.  CT angio has been ordered but she needs to be on less oxygen so hold for now hf [id number]/miun Iv solu medrol, iv remdesivir [id number] Plan is to return to home Following for progression. Expected Discharge Plan: Home/Self Care Barriers to Discharge: No Barriers Identified  Expected Discharge Plan and Services Expected Discharge Plan: Home/Self Care   Discharge Planning Services: CM Consult   Living arrangements for the past 2 months: Single Family Home                                       Social Determinants of Health (SDOH) Interventions    Readmission Risk Interventions No flowsheet data found.

## 2020-05-26 NOTE — Progress Notes (Signed)
eLink Physician-Brief Progress Note Patient Name: Teresa Gillespie DOB: 06-Jan-1946 MRN: [id number]   Date of Service  05/26/2020  HPI/Events of Note  Hyperglycemia - Blood glucose = 262. Patient on [facility] Novolog SSI.   eICU Interventions  Will order: 1. Q 4 hour moderate Novolog SSI. 2. D/C AC Novolog SSI.     Intervention Category Major Interventions: Hyperglycemia - active titration of insulin therapy  Lenell Antu 05/26/2020, 10:23 PM

## 2020-05-26 DEATH — deceased

## 2020-05-27 DIAGNOSIS — U071 COVID-19: Secondary | ICD-10-CM | POA: Diagnosis not present

## 2020-05-27 DIAGNOSIS — J9600 Acute respiratory failure, unspecified whether with hypoxia or hypercapnia: Secondary | ICD-10-CM | POA: Diagnosis not present

## 2020-05-27 DIAGNOSIS — N179 Acute kidney failure, unspecified: Secondary | ICD-10-CM | POA: Diagnosis not present

## 2020-05-27 DIAGNOSIS — E875 Hyperkalemia: Secondary | ICD-10-CM | POA: Diagnosis not present

## 2020-05-27 DIAGNOSIS — R7401 Elevation of levels of liver transaminase levels: Secondary | ICD-10-CM | POA: Diagnosis not present

## 2020-05-27 LAB — CBC WITH DIFFERENTIAL/PLATELET
Abs Immature Granulocytes: 0.28 10*3/uL — ABNORMAL HIGH (ref 0.00–0.07)
Basophils Absolute: 0 10*3/uL (ref 0.0–0.1)
Basophils Relative: 0 %
Eosinophils Absolute: 0 10*3/uL (ref 0.0–0.5)
Eosinophils Relative: 0 %
HCT: 56.5 % — ABNORMAL HIGH (ref 36.0–46.0)
Hemoglobin: 17.6 g/dL — ABNORMAL HIGH (ref 12.0–15.0)
Immature Granulocytes: 2 %
Lymphocytes Relative: 6 %
Lymphs Abs: 0.7 10*3/uL (ref 0.7–4.0)
MCH: 26.7 pg (ref 26.0–34.0)
MCHC: 31.2 g/dL (ref 30.0–36.0)
MCV: 85.9 fL (ref 80.0–100.0)
Monocytes Absolute: 0.9 10*3/uL (ref 0.1–1.0)
Monocytes Relative: 7 %
Neutro Abs: 10.2 10*3/uL — ABNORMAL HIGH (ref 1.7–7.7)
Neutrophils Relative %: 85 %
Platelets: 144 10*3/uL — ABNORMAL LOW (ref 150–400)
RBC: 6.58 MIL/uL — ABNORMAL HIGH (ref 3.87–5.11)
RDW: 17.3 % — ABNORMAL HIGH (ref 11.5–15.5)
WBC: 12.2 10*3/uL — ABNORMAL HIGH (ref 4.0–10.5)
nRBC: 0 % (ref 0.0–0.2)

## 2020-05-27 LAB — COMPREHENSIVE METABOLIC PANEL
ALT: 101 U/L — ABNORMAL HIGH (ref 0–44)
AST: 35 U/L (ref 15–41)
Albumin: 3.4 g/dL — ABNORMAL LOW (ref 3.5–5.0)
Alkaline Phosphatase: 57 U/L (ref 38–126)
Anion gap: 16 — ABNORMAL HIGH (ref 5–15)
BUN: 31 mg/dL — ABNORMAL HIGH (ref 8–23)
CO2: 26 mmol/L (ref 22–32)
Calcium: 8.5 mg/dL — ABNORMAL LOW (ref 8.9–10.3)
Chloride: 99 mmol/L (ref 98–111)
Creatinine, Ser: 0.66 mg/dL (ref 0.44–1.00)
GFR, Estimated: >60 mL/min (ref >60)
Glucose, Bld: 188 mg/dL — ABNORMAL HIGH (ref 70–99)
Potassium: 4.5 mmol/L (ref 3.5–5.1)
Sodium: 141 mmol/L (ref 135–145)
Total Bilirubin: 1.1 mg/dL (ref 0.3–1.2)
Total Protein: 7.1 g/dL (ref 6.5–8.1)

## 2020-05-27 LAB — GLUCOSE, CAPILLARY
Glucose-Capillary: 169 mg/dL — ABNORMAL HIGH (ref 70–99)
Glucose-Capillary: 219 mg/dL — ABNORMAL HIGH (ref 70–99)
Glucose-Capillary: 252 mg/dL — ABNORMAL HIGH (ref 70–99)
Glucose-Capillary: 209 mg/dL — ABNORMAL HIGH (ref 70–99)

## 2020-05-27 LAB — C-REACTIVE PROTEIN: CRP: 6.6 mg/dL — ABNORMAL HIGH (ref <1.0)

## 2020-05-27 MED ORDER — LINAGLIPTIN 5 MG PO TABS
5.0000 mg | ORAL_TABLET | Freq: Every day | ORAL | Status: DC
  Administered 2020-05-27 – 2020-06-08 (×13): 5 mg via ORAL
  Filled 2020-05-27 (×13): qty 1

## 2020-05-27 MED ORDER — FUROSEMIDE 10 MG/ML IJ SOLN
40.0000 mg | Freq: Every day | INTRAMUSCULAR | Status: DC
  Administered 2020-05-27: 40 mg via INTRAVENOUS
  Filled 2020-05-27: qty 4

## 2020-05-27 NOTE — Progress Notes (Signed)
NAME:  Teresa Gillespie, MRN:  [id number], DOB:  [birth year], LOS: 7 ADMISSION DATE:  06-17-20, CONSULTATION DATE:  05/27/2020 REFERRING MD:  Tyrone Nine, MD, CHIEF COMPLAINT:  Respiratory failure   Brief History  Teresa Gillespie is a 74 y.o. woman with a past medical history of hypertension and obesity presents with respiratory failure.  She presented to [facility] emergency department on 11/25 with dyspnea of 2 days duration.  She fell 6 on 11/23 after attending a funeral.  Apparently all of her other family members were also positive for COVID-19.  Given her profound hypoxemia saturations 80% on room air at that time he was transferred to [facility] and admitted to the progressive care unit requiring heated high flow.  Over the past 6 days she is been treated with remdesivir baricitinib and steroids.  She had some initial acute kidney injury and acute liver injury which has now improved.  Her oxygen requirements have progressively increased.  Hospital medicine consulting for worsening respiratory failure with concern for impending intubation.  Past Medical History  HTN, Obesity  Significant Hospital Events   12/1 O2 requirements increased, upgraded to ICU level of care  Consults:  PCCM  Procedures:    Significant Diagnostic Tests:    Micro Data:  11/25 covid 19 positive  Antimicrobials:  Levofloxacin   Interim history/subjective:  This morning 94% on nonrebreather with HHFNC.  Watching tv, able to tell me she is watching living single. Asking for mouth swabs due to dry mouth. Not eating as much.  Objective   Blood pressure (!) 165/74, pulse 60, temperature (!) 97.4 F (36.3 C), temperature source Axillary, resp. rate 18, height 5' (1.524 m), weight 85.9 kg, SpO2 90 %.    FiO2 (%):  [100 %] 100 %   Intake/Output Summary (Last 24 hours) at 05/27/2020 0839 Last data filed at 05/27/2020 0400 Gross per 24 hour  Intake 300 ml  Output 1625 ml  Net -1325 ml    Filed Weights   2020/06/17 1900  Weight: 85.9 kg    Examination: General: elderly acute ill woman on HHFNC and non-rebreather HENT: dry mucus membranes Lungs: diminished, no wheezes or crackles, able to speak in full sentences.  Cardiovascular: RRR, no mrg Abdomen: soft, nontender Extremities: no edema Neuro: AOx3, answers questions appropriately  Resolved Hospital Problem list     Assessment & Plan:   The patient is a 74 y.o. with a history of hypertension and obesity who presents with  Acute hypoxemic respiratory failure COVID-19 pneumonia -She is status post remdesivir, and empiric levofloxacin. She is still on baricitinib.She is still on steroids as well -Continue prophylactic enoxaparin dosing for elevated d-dimer.  - continue to monitor closely for intubation.   Best practice (evaluated daily)   Diet: sips as tolerated.  Pain/Anxiety/Delirium protocol (if indicated): n/a  VAP protocol (if indicated):n/a DVT prophylaxis: on lovenox therapeutic GI prophylaxis: n/a Glucose control: goal <180 Mobility: bed rest last date of multidisciplinary goals of care discussion 12/1 talked with patient Family and staff present bedside nurse Morrie Sheldon Summary of discussion patient discusses a desire to live.  I explained that if there is any chance she is going to go to the hospital and the ventilator would be medically necessary.  I have conveyed this to her daughter Alvis Lemmings who will relay it to her other sister and her father.  They understand that there is a high risk of mortality regardless. Follow up goals of care discussion due 12/4 Code  Status: Full Disposition: ICU  The patient is critically ill with multiple organ systems failure and requires high complexity decision making for assessment and support, frequent evaluation and titration of therapies, application of advanced monitoring technologies and extensive interpretation of multiple databases.   Critical Care Time devoted to  patient care services described in this note is 34 minutes. This time reflects time of care of this signee Charlott Holler . This critical care time does not reflect separately billable procedures or procedure time, teaching time or supervisory time of PA/NP/Med student/Med Resident etc but could involve care discussion time.  Charlott Holler  Pulmonary and Critical Care Medicine 05/27/2020 8:39 AM  Pager: [phone] After hours pager: [phone]   Labs   CBC: Recent Labs  Lab 05/22/20 0213 05/22/20 0213 05/23/20 0839 05/24/20 1034 05/25/20 0333 05/26/20 0309 05/27/20 0254  WBC 16.1*   < > 12.9* 13.3* 11.1* 13.5* 12.2*  NEUTROABS 14.0*  --  10.7* 10.8* 8.8*  --  10.2*  HGB 14.8   < > 15.8* 15.0 15.0 12.8 17.6*  HCT 46.7*   < > 50.5* 48.6* 48.5* 40.2 56.5*  MCV 85.5   < > 85.3 86.9 86.3 84.5 85.9  PLT 206   < > 210 219 195 190 144*   < > = values in this interval not displayed.    Basic Metabolic Panel: Recent Labs  Lab 05/21/20 0250 05/21/20 0250 05/22/20 0213 05/22/20 0213 05/23/20 0252 05/24/20 1034 05/25/20 0333 05/26/20 0309 05/27/20 0254  NA 145   < > 150*   < > 151* 147* 145 142 141  K 4.1   < > 4.0   < > 4.3 4.0 4.5 4.9 4.5  CL 106   < > 110   < > 114* 108 106 100 99  CO2 25   < > 27   < > 24 26 27 29 26   GLUCOSE 170*   < > 184*   < > 151* 193* 142* 190* 188*  BUN 57*   < > 36*   < > 33* 31* 29* 27* 31*  CREATININE 1.64*   < > 0.93   < > 0.81 0.94 0.89 0.55 0.66  CALCIUM 8.3*   < > 8.3*   < > 8.2* 8.4* 8.3* 8.0* 8.5*  MG 3.4*   < > 3.5*  --  3.8* 3.4* 3.4* 2.8*  --   PHOS 3.1  --  2.1*  --  2.5 2.5 3.2  --   --    < > = values in this interval not displayed.   GFR: Estimated Creatinine Clearance: 60.1 mL/min (by C-G formula based on SCr of 0.66 mg/dL). Recent Labs  Lab 2020-06-17 1208 2020/06/17 1354 05/21/20 0250 05/24/20 1034 05/25/20 0333 05/26/20 0309 05/27/20 0254  PROCALCITON  --  1.65  --   --   --   --   --   WBC 9.7  --    < > 13.3*  11.1* 13.5* 12.2*  LATICACIDVEN >11* 8.9*  --   --   --   --   --    < > = values in this interval not displayed.    Liver Function Tests: Recent Labs  Lab 05/23/20 0252 05/24/20 1034 05/25/20 0333 05/26/20 0309 05/27/20 0254  AST 170* 72* 57* 43* 35  ALT 283* 210* 164* 119* 101*  ALKPHOS 66 65 61 57 57  BILITOT 0.8 0.8 0.9 0.3 1.1  PROT 6.8 7.0 6.9 6.6 7.1  ALBUMIN  3.1* 3.2* 3.3* 3.0* 3.4*   No results for input(s): LIPASE, AMYLASE in the last 168 hours. No results for input(s): AMMONIA in the last 168 hours.  ABG    Component Value Date/Time   PHART 7.480 (H) 05/25/2020 1030   PCO2ART 36.6 05/25/2020 1030   PO2ART 59.2 (L) 05/25/2020 1030   HCO3 26.9 05/25/2020 1030   ACIDBASEDEF 2.4 (H) 05/05/2020 1330   O2SAT 87.9 05/25/2020 1030     Coagulation Profile: Recent Labs  Lab 04/27/2020 1208  INR 1.3*    Cardiac Enzymes: No results for input(s): CKTOTAL, CKMB, CKMBINDEX, TROPONINI in the last 168 hours.  HbA1C: Hgb A1c MFr Bld  Date/Time Value Ref Range Status  05/22/2020 07:26 AM 6.6 (H) 4.8 - 5.6 % Final    Comment:    RESULTS CONFIRMED BY MANUAL DILUTION (NOTE) Pre diabetes:          5.7%-6.4%  Diabetes:              >6.4%  Glycemic control for   <7.0% adults with diabetes     CBG: Recent Labs  Lab 05/26/20 1147 05/26/20 1757 05/26/20 2134 05/26/20 2310 05/27/20 0753  GLUCAP 190* 237* 262* 198* 169*

## 2020-05-27 NOTE — TOC Progression Note (Signed)
Transition of Care [facility]) - Progression Note    Patient Details  Name: Teresa Gillespie MRN: [id number] Date of Birth: [birth year]  Transition of Care [facility]) CM/SW Contact  Golda Acre, RN Phone Number: 05/27/2020, 7:49 AM  Clinical Narrative:    74 y.o. woman with a past medical history of hypertension and obesity presents with respiratory failure.  She presented to [facility] emergency department on 11/25 with dyspnea of 2 days duration.  She fell 6 on 11/23 after attending a funeral.  Apparently all of her other family members were also positive for COVID-19.  Given her profound hypoxemia saturations 80% on room air at that time he was transferred to [facility] and admitted to the progressive care unit requiring heated high flow.  Over the past 6 days she is been treated with remdesivir baricitinib and steroids.  She had some initial acute kidney injury and acute liver injury which has now improved.  Her oxygen requirements have progressively increased.  Hospital medicine consulting for worsening respiratory failure with concern for impending intubation. Plan: following to if intubation is need and toc need. Past Medical History  HTN, Obesity  Significant Hospital Events   12/1 O2 requirements increased, upgraded to ICU level of care    Expected Discharge Plan: Home/Self Care Barriers to Discharge: No Barriers Identified  Expected Discharge Plan and Services Expected Discharge Plan: Home/Self Care   Discharge Planning Services: CM Consult   Living arrangements for the past 2 months: Single Family Home                                       Social Determinants of Health (SDOH) Interventions    Readmission Risk Interventions No flowsheet data found.

## 2020-05-27 NOTE — Progress Notes (Signed)
PROGRESS NOTE  Teresa Gillespie  WGN:[id number] DOB: 09-27-1945 DOA: 2020/06/03 PCP: Benita Stabile, MD   Brief Narrative: Teresa Gillespie is a 74 y.o. female with a history of HTN and obesity who fell ill after a funeral on 11/23 and presented to [facility] 11/25 with severe dyspnea saturating 50% on room air. SARS-CoV-2 PCR was positive with CXR showing bilateral patchy opacities. Labs also indicated acute liver injury and acute renal failure. She was admitted to [facility] SDU requiring heated high flow oxygen.  Assessment & Plan: Active Problems:   Acute respiratory failure due to COVID-19 [facility])   AKI (acute kidney injury) (HCC)   Transaminasemia   Hyperkalemia  Acute hypoxemic respiratory failure due to covid-19 pneumonia: SARS-CoV-2 PCR positive on 11/25.  - Remains at very high risk of intubation. With or without this intervention, expected inpatient mortality for this patient is very high. Appreciate PCCM consultation.  - s/p remdesivir x5 days (11/27 - 12/1) - Continue steroids. CRP down again. Has completed course of levaquin empirically for elevated PCT on admission.  - Continue baricitinib for now (started 11/28) with improving LFTs and creatinine, low suspicion for DILI. - Encourage OOB, IS, FV, and awake proning if able - Continue airborne, contact precautions for 21 days from positive testing. - Monitor CMP and inflammatory markers - Enoxaparin 40mg  IV q12h with inability to perform CTA chest and elevated d-dimer. Venous LE U/S negative for DVT. Echo with mildly elevated PASP but normal RV size and function.  Acute renal failure: Multifactorial related to poor po intake, decreased EABV, and prolonged/severe hypoxia/covid. Renal U/S unremarkable.  - Improved with IVF, creatinine normalized from 2.79 on admission. Lasix given 11/30, 12/1, 12/2 with good UOP. No peripheral edema and crackles have improved. Will not continue standing diuretic.   Acute liver injury, hepatic steatosis:  Seen on U/S. Hx prior cholecystectomy without biliary dilatation. LFTs improving.  - Continue monitoring LFTs, ALT down further and AST normalized.  T2DM with steroid-induced hyperglycemia: HbA1c 6.6% indicated mild diabetes.  - SSI changed to q4h for better control and with poor per oral intake, add linagliptin.   Hyponatremia: Improved.   HTN:  - Continue norvasc, avoid hypotension. BP stable.  Obesity: Estimated body mass index is 36.98 kg/m as calculated from the following:   Height as of this encounter: 5' (1.524 m).   Weight as of this encounter: 85.9 kg.  DVT prophylaxis: Lovenox Code Status: Full Family Communication: Daughter by phone Disposition Plan:  Status is: Inpatient  Remains inpatient appropriate because:Inpatient level of care appropriate due to severity of illness  Dispo: The patient is from: Home              Anticipated d/c is to: TBD              Anticipated d/c date is: > 3 days              Patient currently is not medically stable to d/c.  Consultants:   None  Procedures:   None  Antimicrobials:  Remdesivir 11/27 - 12/1  Levaquin 11/25 - 11/28  Subjective: Feels much better than her clinical situation would suggest. Denies shortness of breath, chest pain, leg swelling or pain. No major changes overnight. Main concern is dry mouth from high level oxygen supplementation. Not eating very much at all.  Objective: Vitals:   05/27/20 0735 05/27/20 0800 05/27/20 0900 05/27/20 1000  BP: (!) 165/74 (!) 151/66 (!) 151/73 (!) 151/77  Pulse: 60 67 67  74  Resp: 18     Temp: (!) 97.4 F (36.3 C)     TempSrc: Axillary     SpO2: 90% (!) 89% 93% 94%  Weight:      Height:        Intake/Output Summary (Last 24 hours) at 05/27/2020 1134 Last data filed at 05/27/2020 0400 Gross per 24 hour  Intake 300 ml  Output 1625 ml  Net -1325 ml   Filed Weights   06-05-2020 1900  Weight: 85.9 kg   Gen: Ill elderly female HEENT: Dry mucous membranes. Pulm:  Nonlabored at rest, speaking full sentences, diminished throughout. 60LPM 100% FiO2, SpO2 90-94% CV: Regular rate and rhythm. No murmur, rub, or gallop. No JVD, no significant dependent edema. GI: Abdomen soft, non-tender, non-distended, with normoactive bowel sounds.  Ext: Warm, no deformities Skin: No rashes, lesions or ulcers on visualized skin. Neuro: Alert and oriented. No focal neurological deficits. Psych: Judgement and insight appear fair. Mood euthymic & affect congruent. Behavior is appropriate.    Data Reviewed: I have personally reviewed following labs and imaging studies  CBC: Recent Labs  Lab 05/22/20 0213 05/22/20 0213 05/23/20 0839 05/24/20 1034 05/25/20 0333 05/26/20 0309 05/27/20 0254  WBC 16.1*   < > 12.9* 13.3* 11.1* 13.5* 12.2*  NEUTROABS 14.0*  --  10.7* 10.8* 8.8*  --  10.2*  HGB 14.8   < > 15.8* 15.0 15.0 12.8 17.6*  HCT 46.7*   < > 50.5* 48.6* 48.5* 40.2 56.5*  MCV 85.5   < > 85.3 86.9 86.3 84.5 85.9  PLT 206   < > 210 219 195 190 144*   < > = values in this interval not displayed.   Basic Metabolic Panel: Recent Labs  Lab 05/21/20 0250 05/21/20 0250 05/22/20 0213 05/22/20 0213 05/23/20 0252 05/24/20 1034 05/25/20 0333 05/26/20 0309 05/27/20 0254  NA 145   < > 150*   < > 151* 147* 145 142 141  K 4.1   < > 4.0   < > 4.3 4.0 4.5 4.9 4.5  CL 106   < > 110   < > 114* 108 106 100 99  CO2 25   < > 27   < > 24 26 27 29 26   GLUCOSE 170*   < > 184*   < > 151* 193* 142* 190* 188*  BUN 57*   < > 36*   < > 33* 31* 29* 27* 31*  CREATININE 1.64*   < > 0.93   < > 0.81 0.94 0.89 0.55 0.66  CALCIUM 8.3*   < > 8.3*   < > 8.2* 8.4* 8.3* 8.0* 8.5*  MG 3.4*   < > 3.5*  --  3.8* 3.4* 3.4* 2.8*  --   PHOS 3.1  --  2.1*  --  2.5 2.5 3.2  --   --    < > = values in this interval not displayed.   GFR: Estimated Creatinine Clearance: 60.1 mL/min (by C-G formula based on SCr of 0.66 mg/dL). Liver Function Tests: Recent Labs  Lab 05/23/20 0252 05/24/20 1034  05/25/20 0333 05/26/20 0309 05/27/20 0254  AST 170* 72* 57* 43* 35  ALT 283* 210* 164* 119* 101*  ALKPHOS 66 65 61 57 57  BILITOT 0.8 0.8 0.9 0.3 1.1  PROT 6.8 7.0 6.9 6.6 7.1  ALBUMIN 3.1* 3.2* 3.3* 3.0* 3.4*   No results for input(s): LIPASE, AMYLASE in the last 168 hours. No results for input(s): AMMONIA in the last  168 hours. Coagulation Profile: Recent Labs  Lab 04/27/2020 1208  INR 1.3*   Cardiac Enzymes: No results for input(s): CKTOTAL, CKMB, CKMBINDEX, TROPONINI in the last 168 hours. BNP (last 3 results) No results for input(s): PROBNP in the last 8760 hours. HbA1C: No results for input(s): HGBA1C in the last 72 hours. CBG: Recent Labs  Lab 05/26/20 1147 05/26/20 1757 05/26/20 2134 05/26/20 2310 05/27/20 0753  GLUCAP 190* 237* 262* 198* 169*   Lipid Profile: No results for input(s): CHOL, HDL, LDLCALC, TRIG, CHOLHDL, LDLDIRECT in the last 72 hours. Thyroid Function Tests: No results for input(s): TSH, T4TOTAL, FREET4, T3FREE, THYROIDAB in the last 72 hours. Anemia Panel: Recent Labs    05/25/20 0333  FERRITIN 619*   Urine analysis:    Component Value Date/Time   COLORURINE YELLOW 02/21/2017 1133   APPEARANCEUR HAZY (A) 02/21/2017 1133   LABSPEC 1.025 02/21/2017 1133   PHURINE 5.0 02/21/2017 1133   GLUCOSEU NEGATIVE 02/21/2017 1133   HGBUR NEGATIVE 02/21/2017 1133   BILIRUBINUR NEGATIVE 02/21/2017 1133   KETONESUR NEGATIVE 02/21/2017 1133   PROTEINUR NEGATIVE 02/21/2017 1133   UROBILINOGEN 0.2 02/28/2010 1254   NITRITE NEGATIVE 02/21/2017 1133   LEUKOCYTESUR LARGE (A) 02/21/2017 1133   Recent Results (from the past 240 hour(s))  Blood Culture (routine x 2)     Status: None   Collection Time: 04/30/2020 12:08 PM   Specimen: BLOOD RIGHT ARM  Result Value Ref Range Status   Specimen Description BLOOD RIGHT ARM DRAWN BY RN  Final   Special Requests   Final    BOTTLES DRAWN AEROBIC AND ANAEROBIC Blood Culture adequate volume   Culture   Final     NO GROWTH 5 DAYS Performed at [facility], [address] [place], [place] [address]    Report Status 05/25/2020 FINAL  Final  Blood Culture (routine x 2)     Status: None   Collection Time: 05/15/2020 12:08 PM   Specimen: BLOOD LEFT ARM  Result Value Ref Range Status   Specimen Description BLOOD LEFT ARM DRAWN BY RN  Final   Special Requests   Final    BOTTLES DRAWN AEROBIC AND ANAEROBIC Blood Culture results may not be optimal due to an excessive volume of blood received in culture bottles   Culture   Final    NO GROWTH 5 DAYS Performed at [facility], [address] [place], [place] [address]    Report Status 05/25/2020 FINAL  Final  Resp Panel by RT-PCR (Flu A&B, Covid) Nasopharyngeal Swab     Status: Abnormal   Collection Time: 05/11/2020 12:20 PM   Specimen: Nasopharyngeal Swab; Nasopharyngeal(NP) swabs in vial transport medium  Result Value Ref Range Status   SARS Coronavirus 2 by RT PCR POSITIVE (A) NEGATIVE Final    Comment: RESULT CALLED TO, READ BACK BY AND VERIFIED WITH: MARTIN D. AT 1335 [id number] BY THOMPSON S. (NOTE) SARS-CoV-2 target nucleic acids are DETECTED.  The SARS-CoV-2 RNA is generally detectable in upper respiratory specimens during the acute phase of infection. Positive results are indicative of the presence of the identified virus, but do not rule out bacterial infection or co-infection with other pathogens not detected by the test. Clinical correlation with patient history and other diagnostic information is necessary to determine patient infection status. The expected result is Negative.  Fact Sheet for Patients: BloggerCourse.comhttps://www.fda.gov/media/152166/download  Fact Sheet for Healthcare Providers: SeriousBroker.ithttps://www.fda.gov/media/152162/download  This test is not yet approved or cleared by the [place] FDA and  has been  authorized for detection and/or diagnosis of SARS-CoV-2 by FDA under an Emergency Use Authorization (EUA).  This EUA will remain in effect  (meaning this test  can be used) for the duration of  the COVID-19 declaration under Section 564(b)(1) of the Act, 21 U.S.C. section 360bbb-3(b)(1), unless the authorization is terminated or revoked sooner.     Influenza A by PCR NEGATIVE NEGATIVE Final   Influenza B by PCR NEGATIVE NEGATIVE Final    Comment: (NOTE) The Xpert Xpress SARS-CoV-2/FLU/RSV plus assay is intended as an aid in the diagnosis of influenza from Nasopharyngeal swab specimens and should not be used as a sole basis for treatment. Nasal washings and aspirates are unacceptable for Xpert Xpress SARS-CoV-2/FLU/RSV testing.  Fact Sheet for Patients: BloggerCourse.com  Fact Sheet for Healthcare Providers: SeriousBroker.it  This test is not yet approved or cleared by the [place] FDA and has been authorized for detection and/or diagnosis of SARS-CoV-2 by FDA under an Emergency Use Authorization (EUA). This EUA will remain in effect (meaning this test can be used) for the duration of the COVID-19 declaration under Section 564(b)(1) of the Act, 21 U.S.C. section 360bbb-3(b)(1), unless the authorization is terminated or revoked.  Performed at [facility], [address], [place], [place] [address]   MRSA PCR Screening     Status: None   Collection Time: 05/22/2020  7:12 PM   Specimen: Nasal Mucosa; Nasopharyngeal  Result Value Ref Range Status   MRSA by PCR NEGATIVE NEGATIVE Final    Comment:        The GeneXpert MRSA Assay (FDA approved for NASAL specimens only), is one component of a comprehensive MRSA colonization surveillance program. It is not intended to diagnose MRSA infection nor to guide or monitor treatment for MRSA infections. Performed at [facility], [address] [place], [place] [address]       Radiology Studies: No results found.  Scheduled Meds: . amLODipine  5 mg Oral Daily  . baricitinib  4 mg Oral Daily  .  Chlorhexidine Gluconate Cloth  6 each Topical Daily  . enoxaparin (LOVENOX) injection  40 mg Subcutaneous Q12H  . furosemide  40 mg Intravenous Daily  . insulin aspart  0-15 Units Subcutaneous Q4H  . linagliptin  5 mg Oral Daily  . mouth rinse  15 mL Mouth Rinse BID  . methylPREDNISolone (SOLU-MEDROL) injection  80 mg Intravenous Q12H  . saline  1 application Each Nare Q6H  . senna-docusate  2 tablet Oral BID   Continuous Infusions:    LOS: 7 days   Time spent: 35 minutes.  Tyrone Nine, MD [facility] [email or web address] 05/27/2020, 11:34 AM

## 2020-05-28 DIAGNOSIS — N179 Acute kidney failure, unspecified: Secondary | ICD-10-CM | POA: Diagnosis not present

## 2020-05-28 DIAGNOSIS — U071 COVID-19: Secondary | ICD-10-CM | POA: Diagnosis not present

## 2020-05-28 DIAGNOSIS — J9600 Acute respiratory failure, unspecified whether with hypoxia or hypercapnia: Secondary | ICD-10-CM | POA: Diagnosis not present

## 2020-05-28 DIAGNOSIS — E875 Hyperkalemia: Secondary | ICD-10-CM | POA: Diagnosis not present

## 2020-05-28 DIAGNOSIS — R7401 Elevation of levels of liver transaminase levels: Secondary | ICD-10-CM | POA: Diagnosis not present

## 2020-05-28 LAB — COMPREHENSIVE METABOLIC PANEL
ALT: 77 U/L — ABNORMAL HIGH (ref 0–44)
AST: 29 U/L (ref 15–41)
Albumin: 3.2 g/dL — ABNORMAL LOW (ref 3.5–5.0)
Alkaline Phosphatase: 54 U/L (ref 38–126)
Anion gap: 19 — ABNORMAL HIGH (ref 5–15)
BUN: 35 mg/dL — ABNORMAL HIGH (ref 8–23)
CO2: 21 mmol/L — ABNORMAL LOW (ref 22–32)
Calcium: 8.3 mg/dL — ABNORMAL LOW (ref 8.9–10.3)
Chloride: 98 mmol/L (ref 98–111)
Creatinine, Ser: 0.83 mg/dL (ref 0.44–1.00)
GFR, Estimated: >60 mL/min (ref >60)
Glucose, Bld: 167 mg/dL — ABNORMAL HIGH (ref 70–99)
Potassium: 4.2 mmol/L (ref 3.5–5.1)
Sodium: 138 mmol/L (ref 135–145)
Total Bilirubin: 1 mg/dL (ref 0.3–1.2)
Total Protein: 7 g/dL (ref 6.5–8.1)

## 2020-05-28 LAB — GLUCOSE, CAPILLARY
Glucose-Capillary: 110 mg/dL — ABNORMAL HIGH (ref 70–99)
Glucose-Capillary: 164 mg/dL — ABNORMAL HIGH (ref 70–99)
Glucose-Capillary: 165 mg/dL — ABNORMAL HIGH (ref 70–99)
Glucose-Capillary: 172 mg/dL — ABNORMAL HIGH (ref 70–99)
Glucose-Capillary: 187 mg/dL — ABNORMAL HIGH (ref 70–99)
Glucose-Capillary: 190 mg/dL — ABNORMAL HIGH (ref 70–99)

## 2020-05-28 LAB — C-REACTIVE PROTEIN: CRP: 3 mg/dL — ABNORMAL HIGH (ref <1.0)

## 2020-05-28 LAB — CBC
HCT: 54 % — ABNORMAL HIGH (ref 36.0–46.0)
Hemoglobin: 16.9 g/dL — ABNORMAL HIGH (ref 12.0–15.0)
MCH: 26.6 pg (ref 26.0–34.0)
MCHC: 31.3 g/dL (ref 30.0–36.0)
MCV: 85 fL (ref 80.0–100.0)
Platelets: 178 10*3/uL (ref 150–400)
RBC: 6.35 MIL/uL — ABNORMAL HIGH (ref 3.87–5.11)
RDW: 16.8 % — ABNORMAL HIGH (ref 11.5–15.5)
WBC: 14.1 10*3/uL — ABNORMAL HIGH (ref 4.0–10.5)
nRBC: 0 % (ref 0.0–0.2)

## 2020-05-28 MED ORDER — MICONAZOLE NITRATE POWD: Freq: Two times a day (BID) | Status: DC

## 2020-05-28 NOTE — Progress Notes (Signed)
Patient SpO2 remained above 90% throughout night. RN and NT assisted pt. To [facility] and then to chair. Patient tolerated move fair, but needed adequate time with position changes to recover.   Two person transferred are highly recommended.

## 2020-05-28 NOTE — Progress Notes (Signed)
PROGRESS NOTE  Teresa CottaShirley N Gillespie  ZOX:[id number] DOB: 03-02-1946 DOA: 05/14/2020 PCP: Benita StabileHall, John Z, MD   Brief Narrative: Teresa Gillespie is a 74 y.o. female with a history of HTN and obesity who fell ill after a funeral on 11/23 and presented to [facility] 11/25 with severe dyspnea saturating 50% on room air. SARS-CoV-2 PCR was positive with CXR showing bilateral patchy opacities. Labs also indicated acute liver injury and acute renal failure. [facility] heated high flow oxygen.  Assessment & Plan: Active Problems:   Acute respiratory failure due to COVID-19 [facility](HCC)   AKI (acute kidney injury) (HCC)   Transaminasemia   Hyperkalemia  Acute hypoxemic respiratory failure due to covid-19 pneumonia: SARS-CoV-2 PCR positive on 11/25.  - Weaning HHF to 45LPM, still tenuous clinical status with high inpatient mortality, though respiratory efforts remains normal. Appreciate PCCM involvement.  - s/p remdesivir x5 days (11/27 - 12/1) - Continue steroids. CRP down further 12/3. Has completed course of levaquin empirically for elevated PCT on admission.  - Continue baricitinib for now (started 11/28)  - Encourage OOB, IS, FV, and awake proning if able - Continue airborne, contact precautions for 21 days from positive testing. - Monitor CMP and inflammatory markers - Enoxaparin 40mg  IV q12h with inability to perform CTA chest and elevated d-dimer. Venous LE U/S negative for DVT. Echo with mildly elevated PASP but normal RV size and function.  Acute renal failure: Multifactorial related to poor po intake, decreased EABV, and prolonged/severe hypoxia/covid. Renal U/S unremarkable.  - Improved with IVF, creatinine normalized from 2.79 on admission. Lasix given 11/30, 12/1, 12/2 with good UOP. No peripheral edema and crackles have improved. Not continuing lasix currently.  Acute liver injury, hepatic steatosis: Seen on U/S. Hx prior cholecystectomy without biliary dilatation. LFTs  improving.  - Continue monitoring LFTs, ALT down further and AST normalized.  T2DM with steroid-induced hyperglycemia: HbA1c 6.6% indicated mild diabetes.  - SSI changed to q4h with resultant better control, will need to adjust once po intake improves.  - Continue (new) linagliptin  Hyponatremia: Improved.   HTN:  - Continue norvasc, avoid hypotension. BP stable.  Obesity: Estimated body mass index is 36.98 kg/m as calculated from the following:   Height as of this encounter: 5' (1.524 m).   Weight as of this encounter: 85.9 kg.  DVT prophylaxis: Lovenox Code Status: Full Family Communication: Daughter by phone Disposition Plan:  Status is: Inpatient  Remains inpatient appropriate because:Inpatient level of care appropriate due to severity of illness  Dispo: The patient is from: Home              Anticipated d/c is to: TBD              Anticipated d/c date is: > 3 days              Patient currently is not medically stable to d/c.  Consultants:   None  Procedures:   None  Antimicrobials:  Remdesivir 11/27 - 12/1  Levaquin 11/25 - 11/28  Subjective: Down to 45LPM, remains without significant dyspnea at rest, some shortness of breath when she got up to chair for a while yesterday. Feeling better. Eating moderately.   Objective: Vitals:   05/28/20 0500 05/28/20 0600 05/28/20 0816 05/28/20 0823  BP: 117/67 135/73    Pulse: (!) 107 74 87   Resp:   (!) 25   Temp:    97.7 F (36.5 C)  TempSrc:  Axillary  SpO2: (!) 84% 93% (!) 85%   Weight:      Height:        Intake/Output Summary (Last 24 hours) at 05/28/2020 0824 Last data filed at 05/28/2020 0533 Gross per 24 hour  Intake 400 ml  Output 350 ml  Net 50 ml   Filed Weights   06-18-20 1900  Weight: 85.9 kg   Gen: 74 y.o. female in no distress Pulm: Nonlabored tachypnea, SpO2 in 80%'s, diminished with bilateral crackles posteriorly. CV: Regular rate and rhythm. No murmur, rub, or gallop. No JVD, no  dependent edema. GI: Abdomen soft, non-tender, non-distended, with normoactive bowel sounds.  Ext: Warm, no deformities Skin: No rashes, lesions or ulcers on visualized skin. Neuro: Alert and oriented. No focal neurological deficits. Psych: Judgement and insight appear fair. Mood euthymic & affect congruent. Behavior is appropriate.    Data Reviewed: I have personally reviewed following labs and imaging studies  CBC: Recent Labs  Lab 05/22/20 0213 05/22/20 0213 05/23/20 0839 05/23/20 0839 05/24/20 1034 05/25/20 0333 05/26/20 0309 05/27/20 0254 05/28/20 0246  WBC 16.1*   < > 12.9*   < > 13.3* 11.1* 13.5* 12.2* 14.1*  NEUTROABS 14.0*  --  10.7*  --  10.8* 8.8*  --  10.2*  --   HGB 14.8   < > 15.8*   < > 15.0 15.0 12.8 17.6* 16.9*  HCT 46.7*   < > 50.5*   < > 48.6* 48.5* 40.2 56.5* 54.0*  MCV 85.5   < > 85.3   < > 86.9 86.3 84.5 85.9 85.0  PLT 206   < > 210   < > 219 195 190 144* 178   < > = values in this interval not displayed.   Basic Metabolic Panel: Recent Labs  Lab 05/22/20 0213 05/22/20 0213 05/23/20 0252 05/23/20 0252 05/24/20 1034 05/25/20 0333 05/26/20 0309 05/27/20 0254 05/28/20 0246  NA 150*   < > 151*   < > 147* 145 142 141 138  K 4.0   < > 4.3   < > 4.0 4.5 4.9 4.5 4.2  CL 110   < > 114*   < > 108 106 100 99 98  CO2 27   < > 24   < > 26 27 29 26  21*  GLUCOSE 184*   < > 151*   < > 193* 142* 190* 188* 167*  BUN 36*   < > 33*   < > 31* 29* 27* 31* 35*  CREATININE 0.93   < > 0.81   < > 0.94 0.89 0.55 0.66 0.83  CALCIUM 8.3*   < > 8.2*   < > 8.4* 8.3* 8.0* 8.5* 8.3*  MG 3.5*  --  3.8*  --  3.4* 3.4* 2.8*  --   --   PHOS 2.1*  --  2.5  --  2.5 3.2  --   --   --    < > = values in this interval not displayed.   GFR: Estimated Creatinine Clearance: 57.9 mL/min (by C-G formula based on SCr of 0.83 mg/dL). Liver Function Tests: Recent Labs  Lab 05/24/20 1034 05/25/20 0333 05/26/20 0309 05/27/20 0254 05/28/20 0246  AST 72* 57* 43* 35 29  ALT 210* 164*  119* 101* 77*  ALKPHOS 65 61 57 57 54  BILITOT 0.8 0.9 0.3 1.1 1.0  PROT 7.0 6.9 6.6 7.1 7.0  ALBUMIN 3.2* 3.3* 3.0* 3.4* 3.2*   No results for input(s): LIPASE, AMYLASE in  the last 168 hours. No results for input(s): AMMONIA in the last 168 hours. Coagulation Profile: No results for input(s): INR, PROTIME in the last 168 hours. Cardiac Enzymes: No results for input(s): CKTOTAL, CKMB, CKMBINDEX, TROPONINI in the last 168 hours. BNP (last 3 results) No results for input(s): PROBNP in the last 8760 hours. HbA1C: No results for input(s): HGBA1C in the last 72 hours. CBG: Recent Labs  Lab 05/27/20 1723 05/27/20 2013 05/28/20 0056 05/28/20 0409 05/28/20 0806  GLUCAP 252* 209* 165* 164* 190*   Lipid Profile: No results for input(s): CHOL, HDL, LDLCALC, TRIG, CHOLHDL, LDLDIRECT in the last 72 hours. Thyroid Function Tests: No results for input(s): TSH, T4TOTAL, FREET4, T3FREE, THYROIDAB in the last 72 hours. Anemia Panel: No results for input(s): VITAMINB12, FOLATE, FERRITIN, TIBC, IRON, RETICCTPCT in the last 72 hours. Urine analysis:    Component Value Date/Time   COLORURINE YELLOW 02/21/2017 1133   APPEARANCEUR HAZY (A) 02/21/2017 1133   LABSPEC 1.025 02/21/2017 1133   PHURINE 5.0 02/21/2017 1133   GLUCOSEU NEGATIVE 02/21/2017 1133   HGBUR NEGATIVE 02/21/2017 1133   BILIRUBINUR NEGATIVE 02/21/2017 1133   KETONESUR NEGATIVE 02/21/2017 1133   PROTEINUR NEGATIVE 02/21/2017 1133   UROBILINOGEN 0.2 02/28/2010 1254   NITRITE NEGATIVE 02/21/2017 1133   LEUKOCYTESUR LARGE (A) 02/21/2017 1133   Recent Results (from the past 240 hour(s))  Blood Culture (routine x 2)     Status: None   Collection Time: Jun 04, 2020 12:08 PM   Specimen: BLOOD RIGHT ARM  Result Value Ref Range Status   Specimen Description BLOOD RIGHT ARM DRAWN BY RN  Final   Special Requests   Final    BOTTLES DRAWN AEROBIC AND ANAEROBIC Blood Culture adequate volume   Culture   Final    NO GROWTH 5  DAYS Performed at [facility] [address], [place], [place] [address]    Report Status 05/25/2020 FINAL  Final  Blood Culture (routine x 2)     Status: None   Collection Time: Jun 04, 2020 12:08 PM   Specimen: BLOOD LEFT ARM  Result Value Ref Range Status   Specimen Description BLOOD LEFT ARM DRAWN BY RN  Final   Special Requests   Final    BOTTLES DRAWN AEROBIC AND ANAEROBIC Blood Culture results may not be optimal due to an excessive volume of blood received in culture bottles   Culture   Final    NO GROWTH 5 DAYS Performed at [facility], [address], [place], [place] [address]    Report Status 05/25/2020 FINAL  Final  Resp Panel by RT-PCR (Flu A&B, Covid) Nasopharyngeal Swab     Status: Abnormal   Collection Time: 06-04-2020 12:20 PM   Specimen: Nasopharyngeal Swab; Nasopharyngeal(NP) swabs in vial transport medium  Result Value Ref Range Status   SARS Coronavirus 2 by RT PCR POSITIVE (A) NEGATIVE Final    Comment: RESULT CALLED TO, READ BACK BY AND VERIFIED WITH: MARTIN D. AT [id number] BY THOMPSON S. (NOTE) SARS-CoV-2 target nucleic acids are DETECTED.  The SARS-CoV-2 RNA is generally detectable in upper respiratory specimens during the acute phase of infection. Positive results are indicative of the presence of the identified virus, but do not rule out bacterial infection or co-infection with other pathogens not detected by the test. Clinical correlation with patient history and other diagnostic information is necessary to determine patient infection status. The expected result is Negative.  Fact Sheet for Patients: BloggerCourse.com  Fact Sheet for Healthcare Providers: SeriousBroker.it  This test  is not yet approved or cleared by the [place] and  has been authorized for detection and/or diagnosis of SARS-CoV-2 by FDA under an Emergency Use Authorization (EUA).  This EUA will remain in effect (meaning this  test  can be used) for the duration of  the COVID-19 declaration under Section 564(b)(1) of the Act, 21 U.S.C. section 360bbb-3(b)(1), unless the authorization is terminated or revoked sooner.     Influenza A by PCR NEGATIVE NEGATIVE Final   Influenza B by PCR NEGATIVE NEGATIVE Final    Comment: (NOTE) The Xpert Xpress SARS-CoV-2/FLU/RSV plus assay is intended as an aid in the diagnosis of influenza from Nasopharyngeal swab specimens and should not be used as a sole basis for treatment. Nasal washings and aspirates are unacceptable for Xpert Xpress SARS-CoV-2/FLU/RSV testing.  Fact Sheet for Patients: BloggerCourse.com  Fact Sheet for Healthcare Providers: SeriousBroker.it  This test is not yet approved or cleared by the [place] FDA and has been authorized for detection and/or diagnosis of SARS-CoV-2 by FDA under an Emergency Use Authorization (EUA). This EUA will remain in effect (meaning this test can be used) for the duration of the COVID-19 declaration under Section 564(b)(1) of the Act, 21 U.S.C. section 360bbb-3(b)(1), unless the authorization is terminated or revoked.  Performed at [facility], [address], [place], [place] [address]   MRSA PCR Screening     Status: None   Collection Time: May 23, 2020  7:12 PM   Specimen: Nasal Mucosa; Nasopharyngeal  Result Value Ref Range Status   MRSA by PCR NEGATIVE NEGATIVE Final    Comment:        The GeneXpert MRSA Assay (FDA approved for NASAL specimens only), is one component of a comprehensive MRSA colonization surveillance program. It is not intended to diagnose MRSA infection nor to guide or monitor treatment for MRSA infections. Performed at [facility], [address] [place], [place] [address]       Radiology Studies: No results found.  Scheduled Meds: . amLODipine  5 mg Oral Daily  . baricitinib  4 mg Oral Daily  . Chlorhexidine  Gluconate Cloth  6 each Topical Daily  . enoxaparin (LOVENOX) injection  40 mg Subcutaneous Q12H  . insulin aspart  0-15 Units Subcutaneous Q4H  . linagliptin  5 mg Oral Daily  . mouth rinse  15 mL Mouth Rinse BID  . methylPREDNISolone (SOLU-MEDROL) injection  80 mg Intravenous Q12H  . saline  1 application Each Nare Q6H  . senna-docusate  2 tablet Oral BID   Continuous Infusions:    LOS: 8 days   Time spent: 35 minutes.  Tyrone Nine, MD [facility] [email or web address] 05/28/2020, 8:24 AM

## 2020-05-28 NOTE — Progress Notes (Signed)
NAME:  Teresa Gillespie, MRN:  [id number], DOB:  [birth year], LOS: 8 ADMISSION DATE:  05/17/2020, CONSULTATION DATE:  12/2 REFERRING MD:  Jarvis Newcomer, CHIEF COMPLAINT:  Dyspnea   Brief History   74 y/o female admitted with acute hypoxemic respiratory failure on 11/25 due to COVID 19 pneumonia.  PCCM consulted on 12/2 in the setting of progressive dyspnea and hypoxemia despite treatment with remdesivir, baricitinib, and steroids.    Past Medical History  Hypertension Obesity due to excess calories  Significant Hospital Events   11/25 admission  Consults:  PCCM  Procedures:    Significant Diagnostic Tests:  11/26 Echo LVEF 55-60%, concentric LVH, PA systolic pressure estimate 37, dilated LA  Micro Data:  11/25 covid 19 positive 11/25 blood culture negative  Antimicrobials:  11/25 levofloxacin through 11/28 Remdesivir 11/27 through 12/1 11/28 baricitinib>   Interim history/subjective:  Feels a little better Sat up in chair some today Says she has a little bit of an appetite Denies shortness of breath  Objective   Blood pressure (!) 152/73, pulse 69, temperature 97.7 F (36.5 C), temperature source Axillary, resp. rate (!) 25, height 5' (1.524 m), weight 85.9 kg, SpO2 92 %.    FiO2 (%):  [90 %-100 %] 90 %   Intake/Output Summary (Last 24 hours) at 05/28/2020 1017 Last data filed at 05/28/2020 9935 Gross per 24 hour  Intake 400 ml  Output 1400 ml  Net -1000 ml   Filed Weights   05/16/2020 1900  Weight: 85.9 kg    Examination: General:  Resting comfortably in bed HENT: NCAT OP clear PULM: Crackles bases B, normal effort CV: RRR, no mgr GI: BS+, soft, nontender MSK: normal bulk and tone Neuro: awake, alert, no distress, MAEW   Resolved Hospital Problem list     Assessment & Plan:  Acute hypoxemic respiratory failure due to ARDS from COVID 19 pneumonia Concern for HCAP > doubt Continue baricitinib for 14-day course Continue ICU monitoring Continue heated  high flow nonrebreather to maintain O2 saturation greater than 80% Tolerate periods of hypoxemia, goal at rest is greater than 85% SaO2, with movement ideally above 75% Decision for intubation should be based on a change in mental status or physical evidence of ventilatory failure such as nasal flaring, accessory muscle use, paradoxical breathing Out of bed to chair as able Incentive spirometry is important, use every hour Prone positioning while in bed   Best practice (evaluated daily)    Labs   CBC: Recent Labs  Lab 05/22/20 0213 05/22/20 0213 05/23/20 0839 05/23/20 0839 05/24/20 1034 05/25/20 0333 05/26/20 0309 05/27/20 0254 05/28/20 0246  WBC 16.1*   < > 12.9*   < > 13.3* 11.1* 13.5* 12.2* 14.1*  NEUTROABS 14.0*  --  10.7*  --  10.8* 8.8*  --  10.2*  --   HGB 14.8   < > 15.8*   < > 15.0 15.0 12.8 17.6* 16.9*  HCT 46.7*   < > 50.5*   < > 48.6* 48.5* 40.2 56.5* 54.0*  MCV 85.5   < > 85.3   < > 86.9 86.3 84.5 85.9 85.0  PLT 206   < > 210   < > 219 195 190 144* 178   < > = values in this interval not displayed.    Basic Metabolic Panel: Recent Labs  Lab 05/22/20 0213 05/22/20 0213 05/23/20 0252 05/23/20 0252 05/24/20 1034 05/25/20 0333 05/26/20 0309 05/27/20 0254 05/28/20 0246  NA 150*   < > 151*   < >  147* 145 142 141 138  K 4.0   < > 4.3   < > 4.0 4.5 4.9 4.5 4.2  CL 110   < > 114*   < > 108 106 100 99 98  CO2 27   < > 24   < > 26 27 29 26  21*  GLUCOSE 184*   < > 151*   < > 193* 142* 190* 188* 167*  BUN 36*   < > 33*   < > 31* 29* 27* 31* 35*  CREATININE 0.93   < > 0.81   < > 0.94 0.89 0.55 0.66 0.83  CALCIUM 8.3*   < > 8.2*   < > 8.4* 8.3* 8.0* 8.5* 8.3*  MG 3.5*  --  3.8*  --  3.4* 3.4* 2.8*  --   --   PHOS 2.1*  --  2.5  --  2.5 3.2  --   --   --    < > = values in this interval not displayed.   GFR: Estimated Creatinine Clearance: 57.9 mL/min (by C-G formula based on SCr of 0.83 mg/dL). Recent Labs  Lab 05/25/20 0333 05/26/20 0309 05/27/20 0254  05/28/20 0246  WBC 11.1* 13.5* 12.2* 14.1*    Liver Function Tests: Recent Labs  Lab 05/24/20 1034 05/25/20 0333 05/26/20 0309 05/27/20 0254 05/28/20 0246  AST 72* 57* 43* 35 29  ALT 210* 164* 119* 101* 77*  ALKPHOS 65 61 57 57 54  BILITOT 0.8 0.9 0.3 1.1 1.0  PROT 7.0 6.9 6.6 7.1 7.0  ALBUMIN 3.2* 3.3* 3.0* 3.4* 3.2*   No results for input(s): LIPASE, AMYLASE in the last 168 hours. No results for input(s): AMMONIA in the last 168 hours.  ABG    Component Value Date/Time   PHART 7.480 (H) 05/25/2020 1030   PCO2ART 36.6 05/25/2020 1030   PO2ART 59.2 (L) 05/25/2020 1030   HCO3 26.9 05/25/2020 1030   ACIDBASEDEF 2.4 (H) 05/05/2020 1330   O2SAT 87.9 05/25/2020 1030     Coagulation Profile: No results for input(s): INR, PROTIME in the last 168 hours.  Cardiac Enzymes: No results for input(s): CKTOTAL, CKMB, CKMBINDEX, TROPONINI in the last 168 hours.  HbA1C: Hgb A1c MFr Bld  Date/Time Value Ref Range Status  05/22/2020 07:26 AM 6.6 (H) 4.8 - 5.6 % Final    Comment:    RESULTS CONFIRMED BY MANUAL DILUTION (NOTE) Pre diabetes:          5.7%-6.4%  Diabetes:              >6.4%  Glycemic control for   <7.0% adults with diabetes     CBG: Recent Labs  Lab 05/27/20 1723 05/27/20 2013 05/28/20 0056 05/28/20 0409 05/28/20 0806  GLUCAP 252* 209* 165* 164* 190*    Review of Systems:     Past Medical History  She,  has a past medical history of Hypertension.   Surgical History    Past Surgical History:  Procedure Laterality Date  . ABDOMINAL HYSTERECTOMY    . CHOLECYSTECTOMY    . NECK SURGERY    . TUBAL LIGATION       Social History   reports that she has never smoked. She has never used smokeless tobacco. She reports that she does not drink alcohol and does not use drugs.   Family History   Her family history is not on file.   Allergies Allergies  Allergen Reactions  . Penicillins Hives and Rash     Home  Medications  Prior to Admission  medications   Medication Sig Start Date End Date Taking? Authorizing Provider  amLODipine (NORVASC) 5 MG tablet Take 1 tablet (5 mg total) by mouth daily. 09/09/16 10/24/16  Johnson, Clanford L, MD  oxyCODONE-acetaminophen (PERCOCET) 5-325 MG tablet Take 1 tablet by mouth every 4 (four) hours as needed for moderate pain. Patient not taking: Reported on 04/26/2020 09/16/17   Dione Booze, MD  oxyCODONE-acetaminophen (PERCOCET) 5-325 MG tablet Take 1 tablet by mouth every 4 (four) hours as needed for moderate pain. Patient not taking: Reported on 04/30/2020 09/16/17   Dione Booze, MD  saccharomyces boulardii (FLORASTOR) 250 MG capsule Take 1 capsule (250 mg total) by mouth 2 (two) times daily. Patient not taking: Reported on 05/16/2020 09/08/16   Cleora Fleet, MD     Critical care time: 30  minutes    Heber Hardin, MD [place] [facility] Pager: [phone] Cell: [phone] If no response, call [phone]

## 2020-05-28 NOTE — Progress Notes (Signed)
Patient external catheter container was emptied but output was only 120 over entire shift. Performed bladder scan and scanned 381 mL. Will continue to monitor and assess.

## 2020-05-28 NOTE — TOC Progression Note (Signed)
Transition of Care [facility]) - Progression Note    Patient Details  Name: Teresa Gillespie MRN: [id number] Date of Birth: August 16, 1945  [facility] of [facility] CM/SW Contact  Golda Acre, RN Phone Number: 05/28/2020, 7:40 AM  Clinical Narrative:    Remains on hfnc with nrb mask at 45l?min, iv solu medrol wbc 14.1 Plan is to follow high risk patient for intubation Following for progression has finish remdesivir.    Expected Discharge Plan: Home/Self Care Barriers to Discharge: No Barriers Identified  Expected Discharge Plan and Services Expected Discharge Plan: Home/Self Care   Discharge Planning Services: CM Consult   Living arrangements for the past 2 months: Single Family Home                                       Social Determinants of Health (SDOH) Interventions    Readmission Risk Interventions No flowsheet data found.

## 2020-05-29 DIAGNOSIS — N179 Acute kidney failure, unspecified: Secondary | ICD-10-CM | POA: Diagnosis not present

## 2020-05-29 DIAGNOSIS — E875 Hyperkalemia: Secondary | ICD-10-CM | POA: Diagnosis not present

## 2020-05-29 DIAGNOSIS — R7401 Elevation of levels of liver transaminase levels: Secondary | ICD-10-CM | POA: Diagnosis not present

## 2020-05-29 DIAGNOSIS — U071 COVID-19: Secondary | ICD-10-CM | POA: Diagnosis not present

## 2020-05-29 LAB — COMPREHENSIVE METABOLIC PANEL
ALT: 63 U/L — ABNORMAL HIGH (ref 0–44)
AST: 28 U/L (ref 15–41)
Albumin: 3 g/dL — ABNORMAL LOW (ref 3.5–5.0)
Alkaline Phosphatase: 55 U/L (ref 38–126)
Anion gap: 13 (ref 5–15)
BUN: 32 mg/dL — ABNORMAL HIGH (ref 8–23)
CO2: 26 mmol/L (ref 22–32)
Calcium: 8.2 mg/dL — ABNORMAL LOW (ref 8.9–10.3)
Chloride: 94 mmol/L — ABNORMAL LOW (ref 98–111)
Creatinine, Ser: 0.8 mg/dL (ref 0.44–1.00)
GFR, Estimated: >60 mL/min (ref >60)
Glucose, Bld: 172 mg/dL — ABNORMAL HIGH (ref 70–99)
Potassium: 4.1 mmol/L (ref 3.5–5.1)
Sodium: 133 mmol/L — ABNORMAL LOW (ref 135–145)
Total Bilirubin: 1.2 mg/dL (ref 0.3–1.2)
Total Protein: 6.5 g/dL (ref 6.5–8.1)

## 2020-05-29 LAB — GLUCOSE, CAPILLARY
Glucose-Capillary: 155 mg/dL — ABNORMAL HIGH (ref 70–99)
Glucose-Capillary: 155 mg/dL — ABNORMAL HIGH (ref 70–99)
Glucose-Capillary: 159 mg/dL — ABNORMAL HIGH (ref 70–99)
Glucose-Capillary: 178 mg/dL — ABNORMAL HIGH (ref 70–99)
Glucose-Capillary: 202 mg/dL — ABNORMAL HIGH (ref 70–99)
Glucose-Capillary: 232 mg/dL — ABNORMAL HIGH (ref 70–99)

## 2020-05-29 LAB — C-REACTIVE PROTEIN: CRP: 1.4 mg/dL — ABNORMAL HIGH (ref <1.0)

## 2020-05-29 MED ORDER — NYSTATIN 100000 UNIT/ML MT SUSP
5.0000 mL | Freq: Four times a day (QID) | OROMUCOSAL | Status: DC
  Administered 2020-05-29 – 2020-06-08 (×29): 500000 [IU] via ORAL
  Filled 2020-05-29 (×26): qty 5

## 2020-05-29 NOTE — Progress Notes (Signed)
Tried off PRB, mouth breathing- resulted in low sp02.

## 2020-05-29 NOTE — Progress Notes (Signed)
PROGRESS NOTE  Teresa Gillespie  PXT:[id number] DOB: 1946/05/08 DOA: May 25, 2020 PCP: Benita Stabile, MD   Brief Narrative: Teresa Gillespie is a 74 y.o. female with a history of HTN and obesity who fell ill after a funeral on 11/23 and presented to [facility] 11/25 with severe dyspnea saturating 50% on room air. SARS-CoV-2 PCR was positive with CXR showing bilateral patchy opacities. Labs also indicated acute liver injury and acute renal failure. She was admitted to [facility] SDU requiring heated high flow oxygen.  Assessment & Plan: Active Problems:   COVID-19   AKI (acute kidney injury) (HCC)   Transaminasemia   Hyperkalemia  Acute hypoxemic respiratory failure due to covid-19 pneumonia: SARS-CoV-2 PCR positive on 11/25.  - Weaning HHF to 45LPM, 90% FiO2, still tenuous clinical status with high inpatient mortality, though respiratory efforts remains normal. Appreciate PCCM involvement.  - s/p remdesivir x5 days (11/27 - 12/1) - Continue steroids.  - S/p course of levaquin empirically for elevated PCT on admission.  - Continue baricitinib (started 11/28)  - Encourage OOB, IS, FV, and awake proning if able - Continue airborne, contact precautions for 21 days from positive testing. - Monitor CMP and inflammatory markers - Enoxaparin 40mg  IV q12h with inability to perform CTA chest and elevated d-dimer. Venous LE U/S negative for DVT. Echo with mildly elevated PASP but normal RV size and function. Recheck d-dimer in AM.  Acute renal failure: Multifactorial related to poor po intake, decreased EABV, and prolonged/severe hypoxia/covid. Renal U/S unremarkable.  - Improved with IVF, creatinine normalized from 2.79 on admission. Lasix given 11/30, 12/1, 12/2 with good UOP. No peripheral edema and crackles have improved. Not continuing lasix currently.  Acute liver injury, hepatic steatosis: Seen on U/S. Hx prior cholecystectomy without biliary dilatation. LFTs improving.  - Continue monitoring LFTs,  ALT down further and AST normalized.  T2DM with steroid-induced hyperglycemia: HbA1c 6.6% indicated mild diabetes.  - SSI changed to q4h with resultant better control, will need to adjust once po intake improves. At inpatient goal currently. - Continue (new) linagliptin  Hyponatremia: Improved.   HTN:  - Continue norvasc, avoid hypotension. BP stable.  Obesity: Estimated body mass index is 36.98 kg/m as calculated from the following:   Height as of this encounter: 5' (1.524 m).   Weight as of this encounter: 85.9 kg.  DVT prophylaxis: Lovenox Code Status: Full Family Communication: Daughter by phone Disposition Plan:  Status is: Inpatient  Remains inpatient appropriate because:Inpatient level of care appropriate due to severity of illness  Dispo: The patient is from: Home              Anticipated d/c is to: TBD              Anticipated d/c date is: > 3 days              Patient currently is not medically stable to d/c.  Consultants:   PCCM  Procedures:   None  Antimicrobials:  Remdesivir 11/27 - 12/1  Levaquin 11/25 - 11/28  Subjective: Up to chair for hours per day including this morning, drinking water for dry mouth. No changes in breathing, denies dyspnea at rest, and minimizes but admits shortness of breath with exertion limiting ability to get just to the chair.  Objective: Vitals:   05/29/20 0900 05/29/20 0943 05/29/20 1000 05/29/20 1100  BP: (!) 144/72 (!) 144/72  137/76  Pulse: 81  84 86  Resp: (!) 24     Temp: 97.7  F (36.5 C)     TempSrc: Oral     SpO2: 94%  97% 99%  Weight:      Height:        Intake/Output Summary (Last 24 hours) at 05/29/2020 1242 Last data filed at 05/29/2020 0000 Gross per 24 hour  Intake 400 ml  Output 120 ml  Net 280 ml   Filed Weights   May 29, 2020 1900  Weight: 85.9 kg   Gen: 74 y.o. female in no distress Pulm: Nonlabored tachypnea, diminished with crackles diffusely CV: Regular rate and rhythm. No murmur, rub, or  gallop. No JVD, no pitting dependent edema. GI: Abdomen soft, non-tender, non-distended, with normoactive bowel sounds.  Ext: Warm, no deformities Skin: No new rashes, lesions or ulcers on visualized skin. Neuro: Alert and oriented. No focal neurological deficits. Psych: Judgement and insight appear fair. Mood euthymic & affect congruent. Behavior is appropriate.    Data Reviewed: I have personally reviewed following labs and imaging studies  CBC: Recent Labs  Lab 05/23/20 0839 05/23/20 0839 05/24/20 1034 05/25/20 0333 05/26/20 0309 05/27/20 0254 05/28/20 0246  WBC 12.9*   < > 13.3* 11.1* 13.5* 12.2* 14.1*  NEUTROABS 10.7*  --  10.8* 8.8*  --  10.2*  --   HGB 15.8*   < > 15.0 15.0 12.8 17.6* 16.9*  HCT 50.5*   < > 48.6* 48.5* 40.2 56.5* 54.0*  MCV 85.3   < > 86.9 86.3 84.5 85.9 85.0  PLT 210   < > 219 195 190 144* 178   < > = values in this interval not displayed.   Basic Metabolic Panel: Recent Labs  Lab 05/23/20 0252 05/23/20 0252 05/24/20 1034 05/24/20 1034 05/25/20 0333 05/26/20 0309 05/27/20 0254 05/28/20 0246 05/29/20 0300  NA 151*   < > 147*   < > 145 142 141 138 133*  K 4.3   < > 4.0   < > 4.5 4.9 4.5 4.2 4.1  CL 114*   < > 108   < > 106 100 99 98 94*  CO2 24   < > 26   < > 27 29 26  21* 26  GLUCOSE 151*   < > 193*   < > 142* 190* 188* 167* 172*  BUN 33*   < > 31*   < > 29* 27* 31* 35* 32*  CREATININE 0.81   < > 0.94   < > 0.89 0.55 0.66 0.83 0.80  CALCIUM 8.2*   < > 8.4*   < > 8.3* 8.0* 8.5* 8.3* 8.2*  MG 3.8*  --  3.4*  --  3.4* 2.8*  --   --   --   PHOS 2.5  --  2.5  --  3.2  --   --   --   --    < > = values in this interval not displayed.   GFR: Estimated Creatinine Clearance: 60.1 mL/min (by C-G formula based on SCr of 0.8 mg/dL). Liver Function Tests: Recent Labs  Lab 05/25/20 0333 05/26/20 0309 05/27/20 0254 05/28/20 0246 05/29/20 0300  AST 57* 43* 35 29 28  ALT 164* 119* 101* 77* 63*  ALKPHOS 61 57 57 54 55  BILITOT 0.9 0.3 1.1 1.0 1.2   PROT 6.9 6.6 7.1 7.0 6.5  ALBUMIN 3.3* 3.0* 3.4* 3.2* 3.0*   No results for input(s): LIPASE, AMYLASE in the last 168 hours. No results for input(s): AMMONIA in the last 168 hours. Coagulation Profile: No results for input(s): INR, PROTIME  in the last 168 hours. Cardiac Enzymes: No results for input(s): CKTOTAL, CKMB, CKMBINDEX, TROPONINI in the last 168 hours. BNP (last 3 results) No results for input(s): PROBNP in the last 8760 hours. HbA1C: No results for input(s): HGBA1C in the last 72 hours. CBG: Recent Labs  Lab 05/28/20 1618 05/28/20 2018 05/29/20 0034 05/29/20 0404 05/29/20 0913  GLUCAP 172* 187* 202* 155* 155*   Lipid Profile: No results for input(s): CHOL, HDL, LDLCALC, TRIG, CHOLHDL, LDLDIRECT in the last 72 hours. Thyroid Function Tests: No results for input(s): TSH, T4TOTAL, FREET4, T3FREE, THYROIDAB in the last 72 hours. Anemia Panel: No results for input(s): VITAMINB12, FOLATE, FERRITIN, TIBC, IRON, RETICCTPCT in the last 72 hours. Urine analysis:    Component Value Date/Time   COLORURINE YELLOW 02/21/2017 1133   APPEARANCEUR HAZY (A) 02/21/2017 1133   LABSPEC 1.025 02/21/2017 1133   PHURINE 5.0 02/21/2017 1133   GLUCOSEU NEGATIVE 02/21/2017 1133   HGBUR NEGATIVE 02/21/2017 1133   BILIRUBINUR NEGATIVE 02/21/2017 1133   KETONESUR NEGATIVE 02/21/2017 1133   PROTEINUR NEGATIVE 02/21/2017 1133   UROBILINOGEN 0.2 02/28/2010 1254   NITRITE NEGATIVE 02/21/2017 1133   LEUKOCYTESUR LARGE (A) 02/21/2017 1133   Recent Results (from the past 240 hour(s))  Blood Culture (routine x 2)     Status: None   Collection Time: 06/29/2019 12:08 PM   Specimen: BLOOD RIGHT ARM  Result Value Ref Range Status   Specimen Description BLOOD RIGHT ARM DRAWN BY RN  Final   Special Requests   Final    BOTTLES DRAWN AEROBIC AND ANAEROBIC Blood Culture adequate volume   Culture   Final    NO GROWTH 5 DAYS Performed at [facility], [address] [place], [place] [address]     Report Status 05/25/2020 FINAL  Final  Blood Culture (routine x 2)     Status: None   Collection Time: 06/29/2019 12:08 PM   Specimen: BLOOD LEFT ARM  Result Value Ref Range Status   Specimen Description BLOOD LEFT ARM DRAWN BY RN  Final   Special Requests   Final    BOTTLES DRAWN AEROBIC AND ANAEROBIC Blood Culture results may not be optimal due to an excessive volume of blood received in culture bottles   Culture   Final    NO GROWTH 5 DAYS Performed at [facility], [address] [place], [place] [address]    Report Status 05/25/2020 FINAL  Final  Resp Panel by RT-PCR (Flu A&B, Covid) Nasopharyngeal Swab     Status: Abnormal   Collection Time: 06/29/2019 12:20 PM   Specimen: Nasopharyngeal Swab; Nasopharyngeal(NP) swabs in vial transport medium  Result Value Ref Range Status   SARS Coronavirus 2 by RT PCR POSITIVE (A) NEGATIVE Final    Comment: RESULT CALLED TO, READ BACK BY AND VERIFIED WITH: MARTIN D. AT [id number] 24, 2021 BY THOMPSON S. (NOTE) SARS-CoV-2 target nucleic acids are DETECTED.  The SARS-CoV-2 RNA is generally detectable in upper respiratory specimens during the acute phase of infection. Positive results are indicative of the presence of the identified virus, but do not rule out bacterial infection or co-infection with other pathogens not detected by the test. Clinical correlation with patient history and other diagnostic information is necessary to determine patient infection status. The expected result is Negative.  Fact Sheet for Patients: BloggerCourse.comhttps://www.fda.gov/media/152166/download  Fact Sheet for Healthcare Providers: SeriousBroker.ithttps://www.fda.gov/media/152162/download  This test is not yet approved or cleared by the [place] FDA and  has been authorized for detection and/or diagnosis of SARS-CoV-2  by FDA under an Emergency Use Authorization (EUA).  This EUA will remain in effect (meaning this test  can be used) for the duration of  the COVID-19 declaration under Section  564(b)(1) of the Act, 21 U.S.C. section 360bbb-3(b)(1), unless the authorization is terminated or revoked sooner.     Influenza A by PCR NEGATIVE NEGATIVE Final   Influenza B by PCR NEGATIVE NEGATIVE Final    Comment: (NOTE) The Xpert Xpress SARS-CoV-2/FLU/RSV plus assay is intended as an aid in the diagnosis of influenza from Nasopharyngeal swab specimens and should not be used as a sole basis for treatment. Nasal washings and aspirates are unacceptable for Xpert Xpress SARS-CoV-2/FLU/RSV testing.  Fact Sheet for Patients: BloggerCourse.com  Fact Sheet for Healthcare Providers: SeriousBroker.it  This test is not yet approved or cleared by the [place] FDA and has been authorized for detection and/or diagnosis of SARS-CoV-2 by FDA under an Emergency Use Authorization (EUA). This EUA will remain in effect (meaning this test can be used) for the duration of the COVID-19 declaration under Section 564(b)(1) of the Act, 21 U.S.C. section 360bbb-3(b)(1), unless the authorization is terminated or revoked.  Performed at [facility], [address] [place], [place] [address]   MRSA PCR Screening     Status: None   Collection Time: 05/12/2020  7:12 PM   Specimen: Nasal Mucosa; Nasopharyngeal  Result Value Ref Range Status   MRSA by PCR NEGATIVE NEGATIVE Final    Comment:        The GeneXpert MRSA Assay (FDA approved for NASAL specimens only), is one component of a comprehensive MRSA colonization surveillance program. It is not intended to diagnose MRSA infection nor to guide or monitor treatment for MRSA infections. Performed at [facility], [address], [place], [place] [address]       Radiology Studies: No results found.  Scheduled Meds: . amLODipine  5 mg Oral Daily  . baricitinib  4 mg Oral Daily  . Chlorhexidine Gluconate Cloth  6 each Topical Daily  . enoxaparin (LOVENOX) injection  40 mg  Subcutaneous Q12H  . insulin aspart  0-15 Units Subcutaneous Q4H  . linagliptin  5 mg Oral Daily  . mouth rinse  15 mL Mouth Rinse BID  . methylPREDNISolone (SOLU-MEDROL) injection  80 mg Intravenous Q12H  . saline  1 application Each Nare Q6H  . senna-docusate  2 tablet Oral BID   Continuous Infusions:    LOS: 9 days   Time spent: 35 minutes.  Tyrone Nine, MD [facility] [email or web address] 05/29/2020, 12:42 PM

## 2020-05-29 NOTE — Progress Notes (Signed)
[facility] PCCM  Chart reviewed Charted vital signs and FiO2 stable Will plan to see again on Monday, but happy to see sooner if needed  Heber Litchfield, MD [place] PCCM Pager: [phone] Cell: [phone] If no response, call [phone]

## 2020-05-30 DIAGNOSIS — J9601 Acute respiratory failure with hypoxia: Secondary | ICD-10-CM | POA: Diagnosis not present

## 2020-05-30 DIAGNOSIS — R7401 Elevation of levels of liver transaminase levels: Secondary | ICD-10-CM | POA: Diagnosis not present

## 2020-05-30 DIAGNOSIS — N179 Acute kidney failure, unspecified: Secondary | ICD-10-CM | POA: Diagnosis not present

## 2020-05-30 DIAGNOSIS — U071 COVID-19: Secondary | ICD-10-CM | POA: Diagnosis not present

## 2020-05-30 LAB — COMPREHENSIVE METABOLIC PANEL
ALT: 60 U/L — ABNORMAL HIGH (ref 0–44)
AST: 30 U/L (ref 15–41)
Albumin: 3.1 g/dL — ABNORMAL LOW (ref 3.5–5.0)
Alkaline Phosphatase: 53 U/L (ref 38–126)
Anion gap: 10 (ref 5–15)
BUN: 30 mg/dL — ABNORMAL HIGH (ref 8–23)
CO2: 30 mmol/L (ref 22–32)
Calcium: 8.3 mg/dL — ABNORMAL LOW (ref 8.9–10.3)
Chloride: 96 mmol/L — ABNORMAL LOW (ref 98–111)
Creatinine, Ser: 0.74 mg/dL (ref 0.44–1.00)
GFR, Estimated: >60 mL/min (ref >60)
Glucose, Bld: 119 mg/dL — ABNORMAL HIGH (ref 70–99)
Potassium: 4.3 mmol/L (ref 3.5–5.1)
Sodium: 136 mmol/L (ref 135–145)
Total Bilirubin: 1.2 mg/dL (ref 0.3–1.2)
Total Protein: 6.4 g/dL — ABNORMAL LOW (ref 6.5–8.1)

## 2020-05-30 LAB — C-REACTIVE PROTEIN: CRP: 0.9 mg/dL (ref <1.0)

## 2020-05-30 LAB — PROCALCITONIN: Procalcitonin: <0.1 ng/mL

## 2020-05-30 LAB — GLUCOSE, CAPILLARY
Glucose-Capillary: 117 mg/dL — ABNORMAL HIGH (ref 70–99)
Glucose-Capillary: 128 mg/dL — ABNORMAL HIGH (ref 70–99)
Glucose-Capillary: 137 mg/dL — ABNORMAL HIGH (ref 70–99)
Glucose-Capillary: 149 mg/dL — ABNORMAL HIGH (ref 70–99)
Glucose-Capillary: 171 mg/dL — ABNORMAL HIGH (ref 70–99)
Glucose-Capillary: 181 mg/dL — ABNORMAL HIGH (ref 70–99)
Glucose-Capillary: 205 mg/dL — ABNORMAL HIGH (ref 70–99)

## 2020-05-30 LAB — D-DIMER, QUANTITATIVE: D-Dimer, Quant: 2.87 ug/mL-FEU — ABNORMAL HIGH (ref 0.00–0.50)

## 2020-05-30 MED ORDER — SALINE SPRAY 0.65 % NA SOLN
1.0000 | NASAL | Status: DC | PRN
  Administered 2020-05-30 – 2020-06-07 (×2): 1 via NASAL
  Filled 2020-05-30: qty 44

## 2020-05-30 NOTE — Progress Notes (Signed)
[facility] PCCM  Chart reviewed Per vitals charted and charted FiO2 looks stable Will ask RT to wean FiO2 today given charted O2 saturation > 90% Will see again 12/6  Heber Arriba, MD [place] PCCM Pager: [phone] Cell: [phone] If no response, call [phone]

## 2020-05-30 NOTE — Progress Notes (Signed)
RN reached out to attending MD, Radonna Ricker for clarification on CTA that is ordered for patient. Awaiting MD response.

## 2020-05-30 NOTE — Evaluation (Signed)
Occupational Therapy Evaluation Patient Details Name: TORIN WHISNER MRN: [id number] DOB: [birth year] Today's Date: 05/30/2020    History of Present Illness LESETTE FRARY is an 74 y.o. female  female with a history of HTN and obesity who found to be SARS-CoV-2 PCR positive. CXR showing bilateral patchy opacities. Labs also indicated acute liver injury and acute renal failure   Clinical Impression   Mrs. Emilee Market is a 74 year old woman admitted with COVID who presents on 30 L HHFNC 90% fiO2 and PNB with o2 sat at 85%. On evaluation patient demonstrates functional upper body strength with MMT but overall generalized weakness, decreased activity tolerance and impaired cardiopulmonary status. Patient min assist for supine to sit, min guard for transfer to [facility] and then recliner. Patient set up for UB ADLs, mod assist for LB dressing and min assist for toileting. Patient's o2 sat dropped to 79% with activity and predominantly maintaining between 84-85%. Patient requires increased time to initiate movement due to mild anxiety. Patient educated on use of breathing techniques, positioning and mobility for recovery. Patient verbalized understanding. Patient will benefit from skilled OT services to improve activity tolerance and cardiopulmonary status  to reduce oxygenation needs in order for patient to return home at discharge.      Follow Up Recommendations  Home health OT    Equipment Recommendations  3 in 1 bedside commode    Recommendations for Other Services       Precautions / Restrictions Precautions Precaution Comments: monitor o2 sats Restrictions Weight Bearing Restrictions: No      Mobility Bed Mobility   Bed Mobility: Supine to Sit     Supine to sit: Min assist;HOB elevated     General bed mobility comments: min assist for hand hold to pull herself into sitting at edge of bed.    Transfers Overall transfer level: Needs assistance Equipment used:  None Transfers: Sit to/from [employer] Sit to Stand: Min guard Stand pivot transfers: Min guard            Balance Overall balance assessment: Mild deficits observed, not formally tested                                         ADL either performed or assessed with clinical judgement   ADL Overall ADL's : Needs assistance/impaired Eating/Feeding: Independent   Grooming: Set up;Sitting   Upper Body Bathing: Set up;Sitting   Lower Body Bathing: Sit to/from stand;Minimal assistance   Upper Body Dressing : Set up;Sitting   Lower Body Dressing: Moderate assistance;Sit to/from stand   Toilet Transfer: Public relations account executive Details (indicate cue type and reason): min assist to rise from [facility] Toileting- Clothing Manipulation and Hygiene: Sit to/from stand;Minimal assistance Toileting - Clothing Manipulation Details (indicate cue type and reason): Min assist for steadying when performing pericare     Functional mobility during ADLs: Min guard       Vision Baseline Vision/History: Wears glasses Wears Glasses: Reading only       Perception     Praxis      Pertinent Vitals/Pain Pain Assessment: No/denies pain     Hand Dominance Right   Extremity/Trunk Assessment Upper Extremity Assessment Upper Extremity Assessment: Overall WFL for tasks assessed   Lower Extremity Assessment Lower Extremity Assessment: Defer to PT evaluation   Cervical / Trunk Assessment Cervical / Trunk Assessment: Normal   Communication  Communication Communication: HOH   Cognition Arousal/Alertness: Awake/alert Behavior During Therapy: WFL for tasks assessed/performed Overall Cognitive Status: Within Functional Limits for tasks assessed                                     General Comments       Exercises     Shoulder Instructions      Home Living Family/patient expects to be discharged to:: Private  residence Living Arrangements: Spouse/significant other;Children Available Help at Discharge: Available 24 hours/day   Home Access: Stairs to enter [employer] of Steps: 2   Home Layout: One level     Bathroom Shower/Tub: Arts development officer: Standard     Home Equipment: Environmental consultant - 2 wheels;Shower seat;Cane - single point          Prior Functioning/Environment Level of Independence: Independent                 OT Problem List: Decreased activity tolerance;Cardiopulmonary status limiting activity;Impaired balance (sitting and/or standing);Decreased knowledge of use of DME or AE;Obesity      OT Treatment/Interventions: Therapeutic exercise;Self-care/ADL training;DME and/or AE instruction;Therapeutic activities;Balance training;Patient/family education    OT Goals(Current goals can be found in the care plan section) Acute Rehab OT Goals Patient Stated Goal: to return home with improved independence OT Goal Formulation: With patient Time For Goal Achievement: 06/13/20 Potential to Achieve Goals: Good  OT Frequency: Min 2X/week   Barriers to D/C:            Co-evaluation              AM-PAC OT "6 Clicks" Daily Activity     Outcome Measure Help from another person eating meals?: None Help from another person taking care of personal grooming?: A Little Help from another person toileting, which includes using toliet, bedpan, or urinal?: A Little Help from another person bathing (including washing, rinsing, drying)?: A Little Help from another person to put on and taking off regular upper body clothing?: A Little Help from another person to put on and taking off regular lower body clothing?: A Lot 6 Click Score: 18   End of Session Nurse Communication: Mobility status  Activity Tolerance: Patient limited by fatigue Patient left: in chair;with call bell/phone within reach  OT Visit Diagnosis: Unsteadiness on feet  (R26.81);Muscle weakness (generalized) (M62.81)                Time: 0712-1975 OT Time Calculation (min): 34 min Charges:  OT General Charges $OT Visit: 1 Visit OT Evaluation $OT Eval Moderate Complexity: 1 Mod OT Treatments $Self Care/Home Management : 8-22 mins  Liviah Cake, OTR/L Acute Care Rehab Services  Office [phone] Pager: [phone]   Kelli Churn 05/30/2020, 4:28 PM

## 2020-05-30 NOTE — Progress Notes (Signed)
[facility] PROGRESS NOTE    Progress Note  Teresa Gillespie  WNU:[id number] DOB: Feb 15, 1946 DOA: 05/16/2020 PCP: Benita Stabile, MD     Brief Narrative:   Teresa Gillespie is an 74 y.o. female  female with a history of HTN and obesity who fell ill after a funeral on 11/23 and presented to [facility] 11/25 with severe dyspnea saturating 50% on room air. SARS-CoV-2 PCR was positive with CXR showing bilateral patchy opacities. Labs also indicated acute liver injury and acute renal failure  Assessment/Plan:   Acute hypoxemic respiratory failure due to COVID-19 [facility]) Today started ninth day and her FiO2 and fluid and rate have been slowly decreasing she is currently on O2 at 90% and a flow rate of 35 we will try to decrease the FiO2 to avoid free radical damaged. She has completed her course of IV remdesivir, she is currently on steroids and baricitinib and will continue for a total of 10 days and 14 days respectively. She was given a 2-day treatment of Levaquin on admission, she has remained afebrile leukocytosis has been  persistently been elevated likely due to steroids will have a low threshold for bacterial infection. Her inflammatory markers have significantly decreased the last time the D-dimer was checked was on the 30th we will go ahead and check again today. Try to wean her oxygen down, get her out of bed to chair and encourage incentive spirometry.   AKI (acute kidney injury) (HCC) Multifactorial in the setting of decreased oral intake, renal ultrasound was unremarkable it improved with IV fluid hydration.  Transaminasemia Hepatic steatosis seen on abdominal ultrasound, likely due to COVID-19 they have normalized.  Diabetes mellitus type 2 with hyperglycemia: With an A1c of 6.6, continue sliding scale insulin.  Mild hypovolemic hyponatremia: Resolved with IV fluid hydration.  Essential hypertension: Continue Norvasc.   DVT prophylaxis: lovenxo Family  Communication:none Status is: Inpatient  Remains inpatient appropriate because:Hemodynamically unstable   Dispo: The patient is from: Home              Anticipated d/c is to: SNF              Anticipated d/c date is: > 3 days              Patient currently is not medically stable to d/c.        Code Status:     Code Status Orders  (From admission, onward)         Start     Ordered   05/07/2020 1821  Full code  Continuous        04/26/2020 1820        Code Status History    Date Active Date Inactive Code Status Order ID Comments User Context   09/07/2016 1511 09/08/2016 2048 Full Code [id number]  Cleora Fleet, MD ED   Advance Care Planning Activity    Advance Directive Documentation     Most Recent Value  Type of Advance Directive Living will, Healthcare Power of Attorney  Pre-existing out of facility DNR order (yellow form or pink MOST form) --  "MOST" Form in Place? --        IV Access:    Peripheral IV   Procedures and diagnostic studies:   No results found.   Medical Consultants:    None.  Anti-Infectives:   Baricitinib  Subjective:    Teresa Gillespie she relates her breathing is unchanged from yesterday.  She did get up to  the chair yesterday she tells me she is about about 10 hours and felt really tired afterwards.  Objective:    Vitals:   05/30/20 0200 05/30/20 0300 05/30/20 0342 05/30/20 0400  BP: 132/68 (!) 164/66  135/64  Pulse: 62 (!) 58  71  Resp:      Temp:      TempSrc:      SpO2: (!) 88% (!) 86% 90% 92%  Weight:      Height:       SpO2: 92 % O2 Flow Rate (L/min): 35 L/min FiO2 (%): 90 %   Intake/Output Summary (Last 24 hours) at 05/30/2020 0655 Last data filed at 05/29/2020 1800 Gross per 24 hour  Intake --  Output 125 ml  Net -125 ml   Filed Weights   06/13/2020 1900  Weight: 85.9 kg    Exam: General exam: In no acute distress. Respiratory system: Good air movement and clear to  auscultation. Cardiovascular system: S1 & S2 heard, RRR. No JVD, murmurs, rubs, gallops or clicks.  Gastrointestinal system: Abdomen is nondistended, soft and nontender.  Central nervous system: Alert and oriented. No focal neurological deficits. Extremities: No pedal edema. Skin: No rashes, lesions or ulcers Psychiatry: Judgement and insight appear normal. Mood & affect appropriate.    Data Reviewed:    Labs: Basic Metabolic Panel: Recent Labs  Lab 05/24/20 1034 05/24/20 1034 05/25/20 0333 05/25/20 0333 05/26/20 0309 05/26/20 0309 05/27/20 0254 05/27/20 0254 05/28/20 0246 05/28/20 0246 05/29/20 0300 05/30/20 0330  NA 147*   < > 145   < > 142  --  141  --  138  --  133* 136  K 4.0   < > 4.5   < > 4.9   < > 4.5   < > 4.2   < > 4.1 4.3  CL 108   < > 106   < > 100  --  99  --  98  --  94* 96*  CO2 26   < > 27   < > 29  --  26  --  21*  --  26 30  GLUCOSE 193*   < > 142*   < > 190*  --  188*  --  167*  --  172* 119*  BUN 31*   < > 29*   < > 27*  --  31*  --  35*  --  32* 30*  CREATININE 0.94   < > 0.89   < > 0.55  --  0.66  --  0.83  --  0.80 0.74  CALCIUM 8.4*   < > 8.3*   < > 8.0*  --  8.5*  --  8.3*  --  8.2* 8.3*  MG 3.4*  --  3.4*  --  2.8*  --   --   --   --   --   --   --   PHOS 2.5  --  3.2  --   --   --   --   --   --   --   --   --    < > = values in this interval not displayed.   GFR Estimated Creatinine Clearance: 60.1 mL/min (by C-G formula based on SCr of 0.74 mg/dL). Liver Function Tests: Recent Labs  Lab 05/26/20 0309 05/27/20 0254 05/28/20 0246 05/29/20 0300 05/30/20 0330  AST 43* 35 29 28 30   ALT 119* 101* 77* 63* 60*  ALKPHOS 57 57 54 55 53  BILITOT 0.3 1.1 1.0 1.2 1.2  PROT 6.6 7.1 7.0 6.5 6.4*  ALBUMIN 3.0* 3.4* 3.2* 3.0* 3.1*   No results for input(s): LIPASE, AMYLASE in the last 168 hours. No results for input(s): AMMONIA in the last 168 hours. Coagulation profile No results for input(s): INR, PROTIME in the last 168 hours. COVID-19  Labs  Recent Labs    05/28/20 0246 05/29/20 0300 05/30/20 0330  CRP 3.0* 1.4* 0.9    Lab Results  Component Value Date   SARSCOV2NAA POSITIVE (A) 05/07/2020    CBC: Recent Labs  Lab 05/23/20 0839 05/23/20 0839 05/24/20 1034 05/25/20 0333 05/26/20 0309 05/27/20 0254 05/28/20 0246  WBC 12.9*   < > 13.3* 11.1* 13.5* 12.2* 14.1*  NEUTROABS 10.7*  --  10.8* 8.8*  --  10.2*  --   HGB 15.8*   < > 15.0 15.0 12.8 17.6* 16.9*  HCT 50.5*   < > 48.6* 48.5* 40.2 56.5* 54.0*  MCV 85.3   < > 86.9 86.3 84.5 85.9 85.0  PLT 210   < > 219 195 190 144* 178   < > = values in this interval not displayed.   Cardiac Enzymes: No results for input(s): CKTOTAL, CKMB, CKMBINDEX, TROPONINI in the last 168 hours. BNP (last 3 results) No results for input(s): PROBNP in the last 8760 hours. CBG: Recent Labs  Lab 05/29/20 1310 05/29/20 1705 05/29/20 1958 05/30/20 0020 05/30/20 0458  GLUCAP 178* 159* 232* 128* 149*   D-Dimer: No results for input(s): DDIMER in the last 72 hours. Hgb A1c: No results for input(s): HGBA1C in the last 72 hours. Lipid Profile: No results for input(s): CHOL, HDL, LDLCALC, TRIG, CHOLHDL, LDLDIRECT in the last 72 hours. Thyroid function studies: No results for input(s): TSH, T4TOTAL, T3FREE, THYROIDAB in the last 72 hours.  Invalid input(s): FREET3 Anemia work up: No results for input(s): VITAMINB12, FOLATE, FERRITIN, TIBC, IRON, RETICCTPCT in the last 72 hours. Sepsis Labs: Recent Labs  Lab 05/25/20 0333 05/26/20 0309 05/27/20 0254 05/28/20 0246  WBC 11.1* 13.5* 12.2* 14.1*   Microbiology Recent Results (from the past 240 hour(s))  Blood Culture (routine x 2)     Status: None   Collection Time: 05/02/2020 12:08 PM   Specimen: BLOOD RIGHT ARM  Result Value Ref Range Status   Specimen Description BLOOD RIGHT ARM DRAWN BY RN  Final   Special Requests   Final    BOTTLES DRAWN AEROBIC AND ANAEROBIC Blood Culture adequate volume   Culture   Final    NO  GROWTH 5 DAYS Performed at [facility], [address], [place], [place] [address]    Report Status 05/25/2020 FINAL  Final  Blood Culture (routine x 2)     Status: None   Collection Time: 04/27/2020 12:08 PM   Specimen: BLOOD LEFT ARM  Result Value Ref Range Status   Specimen Description BLOOD LEFT ARM DRAWN BY RN  Final   Special Requests   Final    BOTTLES DRAWN AEROBIC AND ANAEROBIC Blood Culture results may not be optimal due to an excessive volume of blood received in culture bottles   Culture   Final    NO GROWTH 5 DAYS Performed at [facility], [address], [place], [place] [address]    Report Status 05/25/2020 FINAL  Final  Resp Panel by RT-PCR (Flu A&B, Covid) Nasopharyngeal Swab     Status: Abnormal   Collection Time: 05/13/2020 12:20 PM   Specimen: Nasopharyngeal Swab; Nasopharyngeal(NP) swabs in vial transport  medium  Result Value Ref Range Status   SARS Coronavirus 2 by RT PCR POSITIVE (A) NEGATIVE Final    Comment: RESULT CALLED TO, READ BACK BY AND VERIFIED WITH: MARTIN D. AT 1610 [id number] 05/19/2020 BY THOMPSON S. (NOTE) SARS-CoV-2 target nucleic acids are DETECTED.  The SARS-CoV-2 RNA is generally detectable in upper respiratory specimens during the acute phase of infection. Positive results are indicative of the presence of the identified virus, but do not rule out bacterial infection or co-infection with other pathogens not detected by the test. Clinical correlation with patient history and other diagnostic information is necessary to determine patient infection status. The expected result is Negative.  Fact Sheet for Patients: BloggerCourse.comhttps://www.fda.gov/media/152166/download  Fact Sheet for Healthcare Providers: SeriousBroker.ithttps://www.fda.gov/media/152162/download  This test is not yet approved or cleared by the [place] FDA and  has been authorized for detection and/or diagnosis of SARS-CoV-2 by FDA under an Emergency Use Authorization (EUA).  This EUA will remain in effect  (meaning this test  can be used) for the duration of  the COVID-19 declaration under Section 564(b)(1) of the Act, 21 U.S.C. section 360bbb-3(b)(1), unless the authorization is terminated or revoked sooner.     Influenza A by PCR NEGATIVE NEGATIVE Final   Influenza B by PCR NEGATIVE NEGATIVE Final    Comment: (NOTE) The Xpert Xpress SARS-CoV-2/FLU/RSV plus assay is intended as an aid in the diagnosis of influenza from Nasopharyngeal swab specimens and should not be used as a sole basis for treatment. Nasal washings and aspirates are unacceptable for Xpert Xpress SARS-CoV-2/FLU/RSV testing.  Fact Sheet for Patients: BloggerCourse.comhttps://www.fda.gov/media/152166/download  Fact Sheet for Healthcare Providers: SeriousBroker.ithttps://www.fda.gov/media/152162/download  This test is not yet approved or cleared by the [place] FDA and has been authorized for detection and/or diagnosis of SARS-CoV-2 by FDA under an Emergency Use Authorization (EUA). This EUA will remain in effect (meaning this test can be used) for the duration of the COVID-19 declaration under Section 564(b)(1) of the Act, 21 U.S.C. section 360bbb-3(b)(1), unless the authorization is terminated or revoked.  Performed at [facility], [address] [place], [place] [address]   MRSA PCR Screening     Status: None   Collection Time: 05/23/2020  7:12 PM   Specimen: Nasal Mucosa; Nasopharyngeal  Result Value Ref Range Status   MRSA by PCR NEGATIVE NEGATIVE Final    Comment:        The GeneXpert MRSA Assay (FDA approved for NASAL specimens only), is one component of a comprehensive MRSA colonization surveillance program. It is not intended to diagnose MRSA infection nor to guide or monitor treatment for MRSA infections. Performed at [facility], [address] [place], [place] [address]      Medications:   . amLODipine  5 mg Oral Daily  . baricitinib  4 mg Oral Daily  . Chlorhexidine Gluconate Cloth  6 each  Topical Daily  . enoxaparin (LOVENOX) injection  40 mg Subcutaneous Q12H  . insulin aspart  0-15 Units Subcutaneous Q4H  . linagliptin  5 mg Oral Daily  . mouth rinse  15 mL Mouth Rinse BID  . methylPREDNISolone (SOLU-MEDROL) injection  80 mg Intravenous Q12H  . nystatin  5 mL Oral QID  . saline  1 application Each Nare Q6H  . senna-docusate  2 tablet Oral BID   Continuous Infusions:    LOS: 10 days   Marinda ElkAbraham Feliz Ortiz  [facility]  05/30/2020, 6:55 AM

## 2020-05-31 DIAGNOSIS — J9601 Acute respiratory failure with hypoxia: Secondary | ICD-10-CM | POA: Diagnosis not present

## 2020-05-31 DIAGNOSIS — I10 Essential (primary) hypertension: Secondary | ICD-10-CM | POA: Diagnosis not present

## 2020-05-31 DIAGNOSIS — R7401 Elevation of levels of liver transaminase levels: Secondary | ICD-10-CM | POA: Diagnosis not present

## 2020-05-31 DIAGNOSIS — J9602 Acute respiratory failure with hypercapnia: Secondary | ICD-10-CM

## 2020-05-31 DIAGNOSIS — U071 COVID-19: Secondary | ICD-10-CM | POA: Diagnosis not present

## 2020-05-31 DIAGNOSIS — R739 Hyperglycemia, unspecified: Secondary | ICD-10-CM | POA: Diagnosis not present

## 2020-05-31 DIAGNOSIS — N179 Acute kidney failure, unspecified: Secondary | ICD-10-CM | POA: Diagnosis not present

## 2020-05-31 DIAGNOSIS — J80 Acute respiratory distress syndrome: Secondary | ICD-10-CM

## 2020-05-31 LAB — GLUCOSE, CAPILLARY
Glucose-Capillary: 161 mg/dL — ABNORMAL HIGH (ref 70–99)
Glucose-Capillary: 182 mg/dL — ABNORMAL HIGH (ref 70–99)
Glucose-Capillary: 160 mg/dL — ABNORMAL HIGH (ref 70–99)
Glucose-Capillary: 138 mg/dL — ABNORMAL HIGH (ref 70–99)
Glucose-Capillary: 133 mg/dL — ABNORMAL HIGH (ref 70–99)

## 2020-05-31 LAB — PROCALCITONIN: Procalcitonin: <0.1 ng/mL

## 2020-05-31 NOTE — TOC Progression Note (Signed)
Transition of Care [facility]) - Progression Note    Patient Details  Name: Teresa Gillespie MRN: [id number] Date of Birth: Dec 09, 1945  Transition of Care [facility]) CM/SW Contact  Golda Acre, RN Phone Number: 05/31/2020, 7:58 AM  Clinical Narrative:    hfnc partial rebreather mask at 30l/min, respiratory to try to wean today [id number], iv solu medrol, d. Dimer 2.87/covid positive patient. Following for progression.   Expected Discharge Plan: Home/Self Care Barriers to Discharge: No Barriers Identified  Expected Discharge Plan and Services Expected Discharge Plan: Home/Self Care   Discharge Planning Services: CM Consult   Living arrangements for the past 2 months: Single Family Home                                       Social Determinants of Health (SDOH) Interventions    Readmission Risk Interventions No flowsheet data found.

## 2020-05-31 NOTE — Progress Notes (Signed)
PT Cancellation Note  Patient Details Name: Teresa Gillespie MRN: [id number] DOB: 18-Jul-1945   Cancelled Treatment:    Reason Eval/Treat Not Completed: Fatigue/lethargy limiting ability to participate, patient was up in recliner for several hours, back to bed. Will check back tomorrow.Blanchard Kelch PT Acute Rehabilitation Services Pager [phone] Office [phone]    Rada Hay 05/31/2020, 4:53 PM

## 2020-05-31 NOTE — Progress Notes (Signed)
NAME:  Teresa Gillespie, MRN:  [id number], DOB:  [birth year], LOS: 11 ADMISSION DATE:  05/07/2020, CONSULTATION DATE:  12/2 REFERRING MD:  Jarvis Newcomer, CHIEF COMPLAINT:  Dyspnea   Brief History   74 y/o female admitted with acute hypoxemic respiratory failure on 11/25 due to COVID 19 pneumonia.  PCCM consulted on 12/2 in the setting of progressive dyspnea and hypoxemia despite treatment with remdesivir, baricitinib, and steroids.    Past Medical History  Hypertension Obesity due to excess calories  Significant Hospital Events   11/25 admission  Consults:  PCCM  Procedures:    Significant Diagnostic Tests:  11/26 Echo LVEF 55-60%, concentric LVH, PA systolic pressure estimate 37, dilated LA  Micro Data:  11/25 covid 19 positive 11/25 blood culture negative  Antimicrobials:  11/25 levofloxacin through 11/28 Remdesivir 11/27 through 12/1 11/28 baricitinib>   Interim history/subjective:  Feels about the same. Appetite is fair.  Objective   Blood pressure (!) 147/75, pulse 85, temperature (!) 97.4 F (36.3 C), temperature source Axillary, resp. rate (!) 22, height 5' (1.524 m), weight 85.9 kg, SpO2 92 %.    FiO2 (%):  [80 %-90 %] 90 %   Intake/Output Summary (Last 24 hours) at 05/31/2020 1053 Last data filed at 05/31/2020 1011 Gross per 24 hour  Intake 480 ml  Output 900 ml  Net -420 ml   Filed Weights   05/04/2020 1900  Weight: 85.9 kg    Examination: General:  Elderly woman sitting up in the chair in NAD, appears stated age. HENT: [place]/AT, eyes anicteric PULM: Upper lobe rales, mild tachypnea without accessory muscle use. CV: Regular rate and rhythm, no murmurs GI: Obese, soft, nontender, nondistended MSK: Appropriate muscle mass, no clubbing or cyanosis Neuro: Sleeping but easily arousable, answering questions appropriately.  Globally weak, no focal deficits   Resolved Hospital Problem list     Assessment & Plan:  Acute hypoxemic respiratory failure due to ARDS  from COVID 19 pneumonia Concern for HAP > doubt with normal PCT. -Continue baricitinib for 14-day course -Consider starting to de-escalate steroids -Continue ICU monitoring -Continue heated high flow nonrebreather to maintain O2 saturation greater than 80% less work of breathing becomes concerning. Tolerate periods of hypoxemia, goal at rest is greater than 85% SaO2, with movement ideally above 75% -Decision for intubation should be based on a change in mental status or physical evidence of ventilatory failure such as nasal flaring, accessory muscle use, paradoxical breathing -Continue progressive mobility -Continue frequent incentive spirometry- use every hour -Prone positioning while in bed  Hyperglycemia -SSI PRN -Continue daily linagliptin  Hypertension -Continue amlodipine  Best practice (evaluated daily)    Labs   CBC: Recent Labs  Lab 05/25/20 0333 05/26/20 0309 05/27/20 0254 05/28/20 0246  WBC 11.1* 13.5* 12.2* 14.1*  NEUTROABS 8.8*  --  10.2*  --   HGB 15.0 12.8 17.6* 16.9*  HCT 48.5* 40.2 56.5* 54.0*  MCV 86.3 84.5 85.9 85.0  PLT 195 190 144* 178    Basic Metabolic Panel: Recent Labs  Lab 05/25/20 0333 05/25/20 0333 05/26/20 0309 05/27/20 0254 05/28/20 0246 05/29/20 0300 05/30/20 0330  NA 145   < > 142 141 138 133* 136  K 4.5   < > 4.9 4.5 4.2 4.1 4.3  CL 106   < > 100 99 98 94* 96*  CO2 27   < > 29 26 21* 26 30  GLUCOSE 142*   < > 190* 188* 167* 172* 119*  BUN 29*   < >  27* 31* 35* 32* 30*  CREATININE 0.89   < > 0.55 0.66 0.83 0.80 0.74  CALCIUM 8.3*   < > 8.0* 8.5* 8.3* 8.2* 8.3*  MG 3.4*  --  2.8*  --   --   --   --   PHOS 3.2  --   --   --   --   --   --    < > = values in this interval not displayed.   GFR: Estimated Creatinine Clearance: 60.1 mL/min (by C-G formula based on SCr of 0.74 mg/dL). Recent Labs  Lab 05/25/20 0333 05/26/20 0309 05/27/20 0254 05/28/20 0246 05/30/20 1227 05/31/20 0332  PROCALCITON  --   --   --   --  <0.10  <0.10  WBC 11.1* 13.5* 12.2* 14.1*  --   --     Liver Function Tests: Recent Labs  Lab 05/26/20 0309 05/27/20 0254 05/28/20 0246 05/29/20 0300 05/30/20 0330  AST 43* 35 29 28 30   ALT 119* 101* 77* 63* 60*  ALKPHOS 57 57 54 55 53  BILITOT 0.3 1.1 1.0 1.2 1.2  PROT 6.6 7.1 7.0 6.5 6.4*  ALBUMIN 3.0* 3.4* 3.2* 3.0* 3.1*   No results for input(s): LIPASE, AMYLASE in the last 168 hours. No results for input(s): AMMONIA in the last 168 hours.  ABG    Component Value Date/Time   PHART 7.480 (H) 05/25/2020 1030   PCO2ART 36.6 05/25/2020 1030   PO2ART 59.2 (L) 05/25/2020 1030   HCO3 26.9 05/25/2020 1030   ACIDBASEDEF 2.4 (H) 05-30-2020 1330   O2SAT 87.9 05/25/2020 1030     Coagulation Profile: No results for input(s): INR, PROTIME in the last 168 hours.  Cardiac Enzymes: No results for input(s): CKTOTAL, CKMB, CKMBINDEX, TROPONINI in the last 168 hours.  HbA1C: Hgb A1c MFr Bld  Date/Time Value Ref Range Status  05/22/2020 07:26 AM 6.6 (H) 4.8 - 5.6 % Final    Comment:    RESULTS CONFIRMED BY MANUAL DILUTION (NOTE) Pre diabetes:          5.7%-6.4%  Diabetes:              >6.4%  Glycemic control for   <7.0% adults with diabetes     CBG: Recent Labs  Lab 05/30/20 1601 05/30/20 2009 05/30/20 2315 05/31/20 0412 05/31/20 0852  GLUCAP 181* 171* 117* 161* 182*     14/06/21, DO 05/31/20 10:58 AM [facility]

## 2020-05-31 NOTE — Progress Notes (Signed)
[facility] PROGRESS NOTE    Progress Note  Teresa Gillespie  IWO:[id number] DOB: Mar 23, 1946 DOA: 04/28/2020 PCP: Benita Stabile, MD     Brief Narrative:   Teresa Gillespie is an 74 y.o. female  female with a history of HTN and obesity who fell ill after a funeral on 11/23 and presented to [facility] 11/25 with severe dyspnea saturating 50% on room air. SARS-CoV-2 PCR was positive with CXR showing bilateral patchy opacities. Labs also indicated acute liver injury and acute renal failure  Assessment/Plan:   Acute hypoxemic respiratory failure due to COVID-19 [facility]) Her oxygen requirements have basically had a mild improvement, she is still on FiO2 of 90% with a flow rate of 30. Continue steroids and baricitinib. Laboratory markers continue to improve. Try to keep the patient prone for Lee 16 hours a day if not prone out of bed to chair, encourage incentive spirometry and flutter valve and continue to work with physical therapy. She relates today she feels tired and her breathing is unchanged compared to yesterday.  AKI (acute kidney injury) [facility]): Likely prerenal azotemia resolved with IV fluid hydration.  Transaminitis: Hepatic steatosis on abdominal ultrasound they have normalized likely due to COVID-19.  Diabetes mellitus type 2 with hyperglycemia: With an A1c of 6.6, rate control on long-acting insulin plus sliding scale.  Mild hypovolemic hyponatremia: Resolved with IV fluid hydration.  Essential hypertension: Continue Norvasc.   DVT prophylaxis: lovenxo Family Communication:none Status is: Inpatient  Remains inpatient appropriate because:Hemodynamically unstable   Dispo: The patient is from: Home              Anticipated d/c is to: SNF              Anticipated d/c date is: > 3 days              Patient currently is not medically stable to d/c.        Code Status:     Code Status Orders  (From admission, onward)         Start     Ordered    05/19/2020 1821  Full code  Continuous        05/07/2020 1820        Code Status History    Date Active Date Inactive Code Status Order ID Comments User Context   09/07/2016 1511 09/08/2016 2048 Full Code [id number]  Cleora Fleet, MD ED   Advance Care Planning Activity    Advance Directive Documentation     Most Recent Value  Type of Advance Directive Living will, Healthcare Power of Attorney  Pre-existing out of facility DNR order (yellow form or pink MOST form) --  "MOST" Form in Place? --        IV Access:    Peripheral IV   Procedures and diagnostic studies:   No results found.   Medical Consultants:    None.  Anti-Infectives:   Baricitinib  Subjective:    Teresa Gillespie relates her breathing is slightly better than yesterday, she got up to the chair and walked and felt really good still winded with ambulation but overall she relates her breathing is a little bit better than yesterday.  Objective:    Vitals:   05/31/20 0000 05/31/20 0036 05/31/20 0355 05/31/20 0700  BP: (!) 171/77     Pulse: 70     Resp:      Temp:    98 F (36.7 C)  TempSrc:    Oral  SpO2: (!) 86% (!) 85% (!) 88%   Weight:      Height:       SpO2: (!) 88 % O2 Flow Rate (L/min): 30 L/min FiO2 (%): 90 %   Intake/Output Summary (Last 24 hours) at 05/31/2020 0721 Last data filed at 05/30/2020 1600 Gross per 24 hour  Intake 480 ml  Output 500 ml  Net -20 ml   Filed Weights   16-Jun-2020 1900  Weight: 85.9 kg    Exam: General exam: In no acute distress. Respiratory system: Good air movement and clear to auscultation. Cardiovascular system: S1 & S2 heard, RRR. No JVD. Gastrointestinal system: Abdomen is nondistended, soft and nontender.  Extremities: No pedal edema. Skin: No rashes, lesions or ulcers Psychiatry: Judgement and insight appear normal. Mood & affect appropriate. Data Reviewed:    Labs: Basic Metabolic Panel: Recent Labs  Lab 05/24/20 1034  05/24/20 1034 05/25/20 0333 05/25/20 0333 05/26/20 0309 05/26/20 0309 05/27/20 0254 05/27/20 0254 05/28/20 0246 05/28/20 0246 05/29/20 0300 05/30/20 0330  NA 147*   < > 145   < > 142  --  141  --  138  --  133* 136  K 4.0   < > 4.5   < > 4.9   < > 4.5   < > 4.2   < > 4.1 4.3  CL 108   < > 106   < > 100  --  99  --  98  --  94* 96*  CO2 26   < > 27   < > 29  --  26  --  21*  --  26 30  GLUCOSE 193*   < > 142*   < > 190*  --  188*  --  167*  --  172* 119*  BUN 31*   < > 29*   < > 27*  --  31*  --  35*  --  32* 30*  CREATININE 0.94   < > 0.89   < > 0.55  --  0.66  --  0.83  --  0.80 0.74  CALCIUM 8.4*   < > 8.3*   < > 8.0*  --  8.5*  --  8.3*  --  8.2* 8.3*  MG 3.4*  --  3.4*  --  2.8*  --   --   --   --   --   --   --   PHOS 2.5  --  3.2  --   --   --   --   --   --   --   --   --    < > = values in this interval not displayed.   GFR Estimated Creatinine Clearance: 60.1 mL/min (by C-G formula based on SCr of 0.74 mg/dL). Liver Function Tests: Recent Labs  Lab 05/26/20 0309 05/27/20 0254 05/28/20 0246 05/29/20 0300 05/30/20 0330  AST 43* 35 29 28 30   ALT 119* 101* 77* 63* 60*  ALKPHOS 57 57 54 55 53  BILITOT 0.3 1.1 1.0 1.2 1.2  PROT 6.6 7.1 7.0 6.5 6.4*  ALBUMIN 3.0* 3.4* 3.2* 3.0* 3.1*   No results for input(s): LIPASE, AMYLASE in the last 168 hours. No results for input(s): AMMONIA in the last 168 hours. Coagulation profile No results for input(s): INR, PROTIME in the last 168 hours. COVID-19 Labs  Recent Labs    05/29/20 0300 05/30/20 0330 05/30/20 1227  DDIMER  --   --  2.87*  CRP  1.4* 0.9  --     Lab Results  Component Value Date   SARSCOV2NAA POSITIVE (A) 05/25/2020    CBC: Recent Labs  Lab 05/24/20 1034 05/25/20 0333 05/26/20 0309 05/27/20 0254 05/28/20 0246  WBC 13.3* 11.1* 13.5* 12.2* 14.1*  NEUTROABS 10.8* 8.8*  --  10.2*  --   HGB 15.0 15.0 12.8 17.6* 16.9*  HCT 48.6* 48.5* 40.2 56.5* 54.0*  MCV 86.9 86.3 84.5 85.9 85.0  PLT 219 195  190 144* 178   Cardiac Enzymes: No results for input(s): CKTOTAL, CKMB, CKMBINDEX, TROPONINI in the last 168 hours. BNP (last 3 results) No results for input(s): PROBNP in the last 8760 hours. CBG: Recent Labs  Lab 05/30/20 1138 05/30/20 1601 05/30/20 2009 05/30/20 2315 05/31/20 0412  GLUCAP 205* 181* 171* 117* 161*   D-Dimer: Recent Labs    05/30/20 1227  DDIMER 2.87*   Hgb A1c: No results for input(s): HGBA1C in the last 72 hours. Lipid Profile: No results for input(s): CHOL, HDL, LDLCALC, TRIG, CHOLHDL, LDLDIRECT in the last 72 hours. Thyroid function studies: No results for input(s): TSH, T4TOTAL, T3FREE, THYROIDAB in the last 72 hours.  Invalid input(s): FREET3 Anemia work up: No results for input(s): VITAMINB12, FOLATE, FERRITIN, TIBC, IRON, RETICCTPCT in the last 72 hours. Sepsis Labs: Recent Labs  Lab 05/25/20 0333 05/26/20 0309 05/27/20 0254 05/28/20 0246 05/30/20 1227 05/31/20 0332  PROCALCITON  --   --   --   --  <0.10 <0.10  WBC 11.1* 13.5* 12.2* 14.1*  --   --    Microbiology No results found for this or any previous visit (from the past 240 hour(s)).   Medications:   . amLODipine  5 mg Oral Daily  . baricitinib  4 mg Oral Daily  . Chlorhexidine Gluconate Cloth  6 each Topical Daily  . enoxaparin (LOVENOX) injection  40 mg Subcutaneous Q12H  . insulin aspart  0-15 Units Subcutaneous Q4H  . linagliptin  5 mg Oral Daily  . mouth rinse  15 mL Mouth Rinse BID  . methylPREDNISolone (SOLU-MEDROL) injection  80 mg Intravenous Q12H  . nystatin  5 mL Oral QID  . saline  1 application Each Nare Q6H  . senna-docusate  2 tablet Oral BID   Continuous Infusions:    LOS: 11 days   Marinda Elk  [facility]  05/31/2020, 7:21 AM

## 2020-06-01 DIAGNOSIS — U071 COVID-19: Secondary | ICD-10-CM

## 2020-06-01 LAB — GLUCOSE, CAPILLARY
Glucose-Capillary: 122 mg/dL — ABNORMAL HIGH (ref 70–99)
Glucose-Capillary: 126 mg/dL — ABNORMAL HIGH (ref 70–99)
Glucose-Capillary: 128 mg/dL — ABNORMAL HIGH (ref 70–99)
Glucose-Capillary: 133 mg/dL — ABNORMAL HIGH (ref 70–99)
Glucose-Capillary: 161 mg/dL — ABNORMAL HIGH (ref 70–99)
Glucose-Capillary: 162 mg/dL — ABNORMAL HIGH (ref 70–99)

## 2020-06-01 LAB — PROCALCITONIN: Procalcitonin: <0.1 ng/mL

## 2020-06-01 MED ORDER — METHYLPREDNISOLONE SODIUM SUCC 40 MG IJ SOLR
40.0000 mg | Freq: Two times a day (BID) | INTRAMUSCULAR | Status: DC
  Administered 2020-06-01 – 2020-06-05 (×9): 40 mg via INTRAVENOUS
  Filled 2020-06-01 (×9): qty 1

## 2020-06-01 NOTE — Progress Notes (Signed)
NAME:  Teresa Gillespie, MRN:  [id number], DOB:  [birth year], LOS: 12 ADMISSION DATE:  2020-06-17, CONSULTATION DATE:  12/2 REFERRING MD:  Jarvis Newcomer, CHIEF COMPLAINT:  Dyspnea   Brief History   74 y/o female admitted with acute hypoxemic respiratory failure on 11/25 due to COVID 19 pneumonia.  PCCM consulted on 12/2 in the setting of progressive dyspnea and hypoxemia despite treatment with remdesivir, baricitinib, and steroids.    Past Medical History  Hypertension Obesity due to excess calories  Significant Hospital Events   11/25 Admission 12/06 Subjectively feels the same,35L flow / 90% FiO2  12/07 40L flow / 90% fiO2  Consults:  PCCM  Procedures:    Significant Diagnostic Tests:   ECHO 11/26 >> LVEF 55-60%, concentric LVH, PA systolic pressure estimate 37, dilated LA  Micro Data:  COVID 11/25 >> positive  BCx2 11/25 >> negative  Antimicrobials:  Levaquin 11/25 >> 11/28 Remdesivir 11/27 >> 12/1 Baricitinib 11/28 >>   Interim history/subjective:  Afebrile  On 40L flow / 90% FiO2, RT reports weaning O2 Glucose range 122-162  Afebrile  I/O 400 ml UOP, -200 ml in last 24 hours Pt reports she is feeling better.  States she still has a dry cough.   Objective   Blood pressure (!) 150/49, pulse 73, temperature 97.9 F (36.6 C), temperature source Axillary, resp. rate (!) 21, height 5' (1.524 m), weight 85.9 kg, SpO2 (!) 89 %.    FiO2 (%):  [90 %] 90 %   Intake/Output Summary (Last 24 hours) at 06/01/2020 0841 Last data filed at 05/31/2020 1200 Gross per 24 hour  Intake 200 ml  Output 400 ml  Net -200 ml   Filed Weights   17-Jun-2020 1900  Weight: 85.9 kg    Examination: General: elderly adult female lying in bed in NAD HEENT: MM pink/moist, [place] + NRB oxygen in place, anicteric  Neuro: AAOx4, speech clear, MAE CV: s1s2 RRR, no m/r/g PULM: non-labored on O2, lungs bilaterally with posterior bibasilar faint crackles  GI: soft, bsx4 active  Extremities: warm/dry,  no edema  Skin: no rashes or lesions  Resolved Hospital Problem list     Assessment & Plan:   Acute hypoxemic respiratory failure due to ARDS from COVID 19 PNA Concern for HAP > doubt with normal PCT. -continue baricitinib to complete 14 day course, renal dosing per pharmacy if needed -solumedrol 40 mg IB BID, wean based on O2 response  -SDU monitoring  -wean O2 for sats >85% at rest, 75% with exertion. Anticipate desaturation with exertion and slow recovery  -pt indicates she would be ok with short term intubation if needed, decision for such should be based on change in mental status, or physical evidence of ventilatory failure -push PT efforts, nutrition and sleep at night  -prone positioning as able   Hyperglycemia -per primary > SSI, linagliptin   Hypertension -per primary, amlodipine   Best practice (evaluated daily)  Per [facility].   PCCM will follow intermittently, will plan to see again 12/9.  Please call if change in clinical status.   Labs   CBC: Recent Labs  Lab 05/26/20 0309 05/27/20 0254 05/28/20 0246  WBC 13.5* 12.2* 14.1*  NEUTROABS  --  10.2*  --   HGB 12.8 17.6* 16.9*  HCT 40.2 56.5* 54.0*  MCV 84.5 85.9 85.0  PLT 190 144* 178    Basic Metabolic Panel: Recent Labs  Lab 05/26/20 0309 05/27/20 0254 05/28/20 0246 05/29/20 0300 05/30/20 0330  NA 142 141 138 133*  136  K 4.9 4.5 4.2 4.1 4.3  CL 100 99 98 94* 96*  CO2 29 26 21* 26 30  GLUCOSE 190* 188* 167* 172* 119*  BUN 27* 31* 35* 32* 30*  CREATININE 0.55 0.66 0.83 0.80 0.74  CALCIUM 8.0* 8.5* 8.3* 8.2* 8.3*  MG 2.8*  --   --   --   --    GFR: Estimated Creatinine Clearance: 60.1 mL/min (by C-G formula based on SCr of 0.74 mg/dL). Recent Labs  Lab 05/26/20 0309 05/27/20 0254 05/28/20 0246 05/30/20 1227 05/31/20 0332 06/01/20 0307  PROCALCITON  --   --   --  <0.10 <0.10 <0.10  WBC 13.5* 12.2* 14.1*  --   --   --     Liver Function Tests: Recent Labs  Lab 05/26/20 0309  05/27/20 0254 05/28/20 0246 05/29/20 0300 05/30/20 0330  AST 43* 35 29 28 30   ALT 119* 101* 77* 63* 60*  ALKPHOS 57 57 54 55 53  BILITOT 0.3 1.1 1.0 1.2 1.2  PROT 6.6 7.1 7.0 6.5 6.4*  ALBUMIN 3.0* 3.4* 3.2* 3.0* 3.1*   No results for input(s): LIPASE, AMYLASE in the last 168 hours. No results for input(s): AMMONIA in the last 168 hours.  ABG    Component Value Date/Time   PHART 7.480 (H) 05/25/2020 1030   PCO2ART 36.6 05/25/2020 1030   PO2ART 59.2 (L) 05/25/2020 1030   HCO3 26.9 05/25/2020 1030   ACIDBASEDEF 2.4 (H) 05/17/2020 1330   O2SAT 87.9 05/25/2020 1030     Coagulation Profile: No results for input(s): INR, PROTIME in the last 168 hours.  Cardiac Enzymes: No results for input(s): CKTOTAL, CKMB, CKMBINDEX, TROPONINI in the last 168 hours.  HbA1C: Hgb A1c MFr Bld  Date/Time Value Ref Range Status  05/22/2020 07:26 AM 6.6 (H) 4.8 - 5.6 % Final    Comment:    RESULTS CONFIRMED BY MANUAL DILUTION (NOTE) Pre diabetes:          5.7%-6.4%  Diabetes:              >6.4%  Glycemic control for   <7.0% adults with diabetes     CBG: Recent Labs  Lab 05/31/20 1642 05/31/20 2053 06/01/20 0027 06/01/20 0448 06/01/20 0822  GLUCAP 138* 133* 128* 162* 122*    14/07/21, MSN, NP-C, AGACNP-BC  Pulmonary & Critical Care 06/01/2020, 8:49 AM   Please see [email or web address] for pager details.

## 2020-06-01 NOTE — Progress Notes (Signed)
[facility] PROGRESS NOTE    Progress Note  Teresa Gillespie  LKT:[id number] DOB: 1945/09/25 DOA: 02-Jun-2020 PCP: Benita Stabile, MD     Brief Narrative:   Teresa Gillespie is an 74 y.o. female  female with a history of HTN and obesity who fell ill after a funeral on 11/23 and presented to [facility] 11/25 with severe dyspnea saturating 50% on room air. SARS-CoV-2 PCR was positive with CXR showing bilateral patchy opacities. Labs also indicated acute liver injury and acute renal failure  Assessment/Plan:   Acute hypoxemic respiratory failure due to COVID-19 [facility]) Oxygen requirements unchanged from the previous day and FiO2 of 90% with a flow rate of 40. She has completed her course of IV remdesivir, will start to wean down steroids, continue baricitinib 14. Prone when in bed, out of bed to chair. Continue to use incentive spirometry, continue work with physical therapy and continue Lovenox for DVT prophylaxis.  AKI (acute kidney injury) [facility]): Likely prerenal azotemia resolved with IV fluid hydration.  Transaminitis: Hepatic steatosis on abdominal ultrasound they have normalized likely due to COVID-19.  Diabetes mellitus type 2 with hyperglycemia: With an A1c of 6.6, continue sliding scale insulin  Mild hypovolemic hyponatremia: Resolved with IV fluid hydration.  Essential hypertension: Continue Norvasc.   DVT prophylaxis: lovenxo Family Communication:none Status is: Inpatient  Remains inpatient appropriate because:Hemodynamically unstable   Dispo: The patient is from: Home              Anticipated d/c is to: SNF              Anticipated d/c date is: > 3 days              Patient currently is not medically stable to d/c.        Code Status:     Code Status Orders  (From admission, onward)         Start     Ordered   06-02-2020 1821  Full code  Continuous        2020/06/02 1820        Code Status History    Date Active Date Inactive Code Status  Order ID Comments User Context   09/07/2016 1511 09/08/2016 2048 Full Code [id number]  Cleora Fleet, MD ED   Advance Care Planning Activity    Advance Directive Documentation     Most Recent Value  Type of Advance Directive Living will, Healthcare Power of Attorney  Pre-existing out of facility DNR order (yellow form or pink MOST form) --  "MOST" Form in Place? --        IV Access:    Peripheral IV   Procedures and diagnostic studies:   No results found.   Medical Consultants:    None.  Anti-Infectives:   Baricitinib  Subjective:    Teresa Gillespie relates her breathing is unchanged compared to yesterday  Objective:    Vitals:   05/31/20 2028 06/01/20 0000 06/01/20 0049 06/01/20 0420  BP:  (!) 158/66    Pulse:  63    Resp: (!) 22  (!) 24 20  Temp: 97.8 F (36.6 C)     TempSrc: Axillary     SpO2:  (!) 86% 91% 90%  Weight:      Height:       SpO2: 90 % O2 Flow Rate (L/min): 40 L/min FiO2 (%): 90 %   Intake/Output Summary (Last 24 hours) at 06/01/2020 0654 Last data filed at 05/31/2020 1200  Gross per 24 hour  Intake 200 ml  Output 400 ml  Net -200 ml   Filed Weights   03-Jun-2020 1900  Weight: 85.9 kg    Exam: General exam: In no acute distress. Respiratory system: Good air movement and diffuse crackles bilaterally Cardiovascular system: S1 & S2 heard, RRR. No JVD. Gastrointestinal system: Abdomen is nondistended, soft and nontender.  Extremities: No pedal edema. Skin: No rashes, lesions or ulcers Psychiatry: Judgement and insight appear normal. Mood & affect appropriate. Data Reviewed:    Labs: Basic Metabolic Panel: Recent Labs  Lab 05/26/20 0309 05/26/20 0309 05/27/20 0254 05/27/20 0254 05/28/20 0246 05/28/20 0246 05/29/20 0300 05/30/20 0330  NA 142  --  141  --  138  --  133* 136  K 4.9   < > 4.5   < > 4.2   < > 4.1 4.3  CL 100  --  99  --  98  --  94* 96*  CO2 29  --  26  --  21*  --  26 30  GLUCOSE 190*  --  188*   --  167*  --  172* 119*  BUN 27*  --  31*  --  35*  --  32* 30*  CREATININE 0.55  --  0.66  --  0.83  --  0.80 0.74  CALCIUM 8.0*  --  8.5*  --  8.3*  --  8.2* 8.3*  MG 2.8*  --   --   --   --   --   --   --    < > = values in this interval not displayed.   GFR Estimated Creatinine Clearance: 60.1 mL/min (by C-G formula based on SCr of 0.74 mg/dL). Liver Function Tests: Recent Labs  Lab 05/26/20 0309 05/27/20 0254 05/28/20 0246 05/29/20 0300 05/30/20 0330  AST 43* 35 29 28 30   ALT 119* 101* 77* 63* 60*  ALKPHOS 57 57 54 55 53  BILITOT 0.3 1.1 1.0 1.2 1.2  PROT 6.6 7.1 7.0 6.5 6.4*  ALBUMIN 3.0* 3.4* 3.2* 3.0* 3.1*   No results for input(s): LIPASE, AMYLASE in the last 168 hours. No results for input(s): AMMONIA in the last 168 hours. Coagulation profile No results for input(s): INR, PROTIME in the last 168 hours. COVID-19 Labs  Recent Labs    05/30/20 0330 05/30/20 1227  DDIMER  --  2.87*  CRP 0.9  --     Lab Results  Component Value Date   SARSCOV2NAA POSITIVE (A) Jun 03, 2020    CBC: Recent Labs  Lab 05/26/20 0309 05/27/20 0254 05/28/20 0246  WBC 13.5* 12.2* 14.1*  NEUTROABS  --  10.2*  --   HGB 12.8 17.6* 16.9*  HCT 40.2 56.5* 54.0*  MCV 84.5 85.9 85.0  PLT 190 144* 178   Cardiac Enzymes: No results for input(s): CKTOTAL, CKMB, CKMBINDEX, TROPONINI in the last 168 hours. BNP (last 3 results) No results for input(s): PROBNP in the last 8760 hours. CBG: Recent Labs  Lab 05/31/20 1222 05/31/20 1642 05/31/20 2053 06/01/20 0027 06/01/20 0448  GLUCAP 160* 138* 133* 128* 162*   D-Dimer: Recent Labs    05/30/20 1227  DDIMER 2.87*   Hgb A1c: No results for input(s): HGBA1C in the last 72 hours. Lipid Profile: No results for input(s): CHOL, HDL, LDLCALC, TRIG, CHOLHDL, LDLDIRECT in the last 72 hours. Thyroid function studies: No results for input(s): TSH, T4TOTAL, T3FREE, THYROIDAB in the last 72 hours.  Invalid input(s): FREET3 Anemia work  up: No results for input(s): VITAMINB12, FOLATE, FERRITIN, TIBC, IRON, RETICCTPCT in the last 72 hours. Sepsis Labs: Recent Labs  Lab 05/26/20 0309 05/27/20 0254 05/28/20 0246 05/30/20 1227 05/31/20 0332 06/01/20 0307  PROCALCITON  --   --   --  <0.10 <0.10 <0.10  WBC 13.5* 12.2* 14.1*  --   --   --    Microbiology No results found for this or any previous visit (from the past 240 hour(s)).   Medications:   . amLODipine  5 mg Oral Daily  . baricitinib  4 mg Oral Daily  . Chlorhexidine Gluconate Cloth  6 each Topical Daily  . enoxaparin (LOVENOX) injection  40 mg Subcutaneous Q12H  . insulin aspart  0-15 Units Subcutaneous Q4H  . linagliptin  5 mg Oral Daily  . mouth rinse  15 mL Mouth Rinse BID  . methylPREDNISolone (SOLU-MEDROL) injection  80 mg Intravenous Q12H  . nystatin  5 mL Oral QID  . saline  1 application Each Nare Q6H  . senna-docusate  2 tablet Oral BID   Continuous Infusions:    LOS: 12 days   Marinda Elk  [facility]  06/01/2020, 6:54 AM

## 2020-06-02 LAB — GLUCOSE, CAPILLARY
Glucose-Capillary: 105 mg/dL — ABNORMAL HIGH (ref 70–99)
Glucose-Capillary: 111 mg/dL — ABNORMAL HIGH (ref 70–99)
Glucose-Capillary: 115 mg/dL — ABNORMAL HIGH (ref 70–99)
Glucose-Capillary: 118 mg/dL — ABNORMAL HIGH (ref 70–99)
Glucose-Capillary: 136 mg/dL — ABNORMAL HIGH (ref 70–99)
Glucose-Capillary: 192 mg/dL — ABNORMAL HIGH (ref 70–99)

## 2020-06-02 MED ORDER — LABETALOL HCL 5 MG/ML IV SOLN: 10.0000 mg | Freq: Once | INTRAVENOUS | Status: DC

## 2020-06-02 NOTE — Progress Notes (Signed)
PROGRESS NOTE  Teresa Gillespie  QPY:[id number] DOB: 1946-02-15 DOA: 05/17/2020 PCP: Benita Stabile, MD   Brief Narrative: Teresa Gillespie is a 74 y.o. female with a history of HTN and obesity who fell ill after a funeral on 11/23 and presented to [facility] 11/25 with severe dyspnea saturating 50% on room air. SARS-CoV-2 PCR was positive with CXR showing bilateral patchy opacities. Labs also indicated acute liver injury and acute renal failure. She was admitted to [facility] SDU requiring heated high flow oxygen and continues to do so. Fortunately, respiratory effort has allowed her to avoid intubation.  Assessment & Plan: Active Problems:   Acute hypoxemic respiratory failure due to COVID-19 [facility])   AKI (acute kidney injury) (HCC)   Transaminasemia   Hyperkalemia  Acute hypoxemic respiratory failure due to covid-19 pneumonia: SARS-CoV-2 PCR positive on 11/25.  - Continue heated high flow and NRB to maintain adequate oxygen saturation and to facilitate mobility as much as possible.   - s/p remdesivir x5 days (11/27 - 12/1) - Continue steroids. CRP normal as of 12/4. - S/p course of levaquin empirically for elevated PCT on admission. PCT negative 12/7.  - Continue baricitinib (started 11/28, last dose would be 12/11)  - Encourage OOB, IS, FV, and awake proning if able - Continue airborne, contact precautions for 21 days from positive testing. - Monitor CMP and inflammatory markers - Enoxaparin 40mg  IV q12h with inability to perform CTA chest and elevated d-dimer. Venous LE U/S negative for DVT. Echo with mildly elevated PASP but normal RV size and function. Recheck d-dimer in AM.  Acute renal failure: Multifactorial related to poor po intake, decreased EABV, and prolonged/severe hypoxia/covid. Renal U/S unremarkable.  - Improved with IVF, creatinine normalized from 2.79 on admission. No peripheral edema and crackles have improved. Not continuing lasix currently.  Acute liver injury, hepatic  steatosis: Seen on U/S. Hx prior cholecystectomy without biliary dilatation. LFTs improving.  - Continue monitoring LFTs, ALT down further and AST normalized.  T2DM with steroid-induced hyperglycemia: HbA1c 6.6% indicated mild diabetes.  - Continue SSI - remains well-controlled - Continue (new) linagliptin  Hyponatremia: Improved.   HTN:  - Continue norvasc, avoid hypotension. BP stable.  Obesity: Estimated body mass index is 36.98 kg/m as calculated from the following:   Height as of this encounter: 5' (1.524 m).   Weight as of this encounter: 85.9 kg.  DVT prophylaxis: Lovenox Code Status: Full Family Communication: None at bedside. Will call with pertinent clinical updates.  Disposition Plan:  Status is: Inpatient  Remains inpatient appropriate because:Inpatient level of care appropriate due to severity of illness  Dispo: The patient is from: Home              Anticipated d/c is to: TBD              Anticipated d/c date is: > 3 days              Patient currently is not medically stable to d/c.  Consultants:   PCCM  Procedures:   None  Antimicrobials:  Remdesivir 11/27 - 12/1  Levaquin 11/25 - 11/28  Subjective: Still getting up to recliner with assistance regularly, not yet today but planning on it. Dyspnea is mild though hypoxia remains severe. No chest pain. Eating better. No bleeding noted.  Objective: Vitals:   06/02/20 0740 06/02/20 0800 06/02/20 0900 06/02/20 1040  BP:  (!) 151/67 (!) 145/56 (!) 151/70  Pulse:  79 76   Resp:  Temp:  98.7 F (37.1 C)    TempSrc:  Axillary    SpO2: 92% 91% 92%   Weight:      Height:        Intake/Output Summary (Last 24 hours) at 06/02/2020 1153 Last data filed at 06/01/2020 2107 Gross per 24 hour  Intake 150 ml  Output 750 ml  Net -600 ml   Filed Weights   June 11, 2020 1900  Weight: 85.9 kg   Gen: 74 y.o. female in no distress Pulm: Nonlabored but tachypneic on HHF + NRB, diminished with barely any  crackles at time of exam. CV: Regular rate and rhythm. No murmur, rub, or gallop. No JVD, no dependent edema. GI: Abdomen soft, non-tender, non-distended, with normoactive bowel sounds.  Ext: Warm, no deformities Skin: No rashes, lesions or ulcers on visualized skin. Neuro: Alert and oriented. No focal neurological deficits. Psych: Judgement and insight appear fair. Mood euthymic & affect congruent. Behavior is appropriate.    Data Reviewed: I have personally reviewed following labs and imaging studies  CBC: Recent Labs  Lab 05/27/20 0254 05/28/20 0246  WBC 12.2* 14.1*  NEUTROABS 10.2*  --   HGB 17.6* 16.9*  HCT 56.5* 54.0*  MCV 85.9 85.0  PLT 144* 178   Basic Metabolic Panel: Recent Labs  Lab 05/27/20 0254 05/28/20 0246 05/29/20 0300 05/30/20 0330  NA 141 138 133* 136  K 4.5 4.2 4.1 4.3  CL 99 98 94* 96*  CO2 26 21* 26 30  GLUCOSE 188* 167* 172* 119*  BUN 31* 35* 32* 30*  CREATININE 0.66 0.83 0.80 0.74  CALCIUM 8.5* 8.3* 8.2* 8.3*   GFR: Estimated Creatinine Clearance: 60.1 mL/min (by C-G formula based on SCr of 0.74 mg/dL). Liver Function Tests: Recent Labs  Lab 05/27/20 0254 05/28/20 0246 05/29/20 0300 05/30/20 0330  AST 35 29 28 30   ALT 101* 77* 63* 60*  ALKPHOS 57 54 55 53  BILITOT 1.1 1.0 1.2 1.2  PROT 7.1 7.0 6.5 6.4*  ALBUMIN 3.4* 3.2* 3.0* 3.1*   No results for input(s): LIPASE, AMYLASE in the last 168 hours. No results for input(s): AMMONIA in the last 168 hours. Coagulation Profile: No results for input(s): INR, PROTIME in the last 168 hours. Cardiac Enzymes: No results for input(s): CKTOTAL, CKMB, CKMBINDEX, TROPONINI in the last 168 hours. BNP (last 3 results) No results for input(s): PROBNP in the last 8760 hours. HbA1C: No results for input(s): HGBA1C in the last 72 hours. CBG: Recent Labs  Lab 06/01/20 1703 06/01/20 2057 06/02/20 0020 06/02/20 0449 06/02/20 0757  GLUCAP 161* 133* 105* 118* 115*   Lipid Profile: No results for  input(s): CHOL, HDL, LDLCALC, TRIG, CHOLHDL, LDLDIRECT in the last 72 hours. Thyroid Function Tests: No results for input(s): TSH, T4TOTAL, FREET4, T3FREE, THYROIDAB in the last 72 hours. Anemia Panel: No results for input(s): VITAMINB12, FOLATE, FERRITIN, TIBC, IRON, RETICCTPCT in the last 72 hours. Urine analysis:    Component Value Date/Time   COLORURINE YELLOW 02/21/2017 1133   APPEARANCEUR HAZY (A) 02/21/2017 1133   LABSPEC 1.025 02/21/2017 1133   PHURINE 5.0 02/21/2017 1133   GLUCOSEU NEGATIVE 02/21/2017 1133   HGBUR NEGATIVE 02/21/2017 1133   BILIRUBINUR NEGATIVE 02/21/2017 1133   KETONESUR NEGATIVE 02/21/2017 1133   PROTEINUR NEGATIVE 02/21/2017 1133   UROBILINOGEN 0.2 02/28/2010 1254   NITRITE NEGATIVE 02/21/2017 1133   LEUKOCYTESUR LARGE (A) 02/21/2017 1133   No results found for this or any previous visit (from the past 240 hour(s)).  Radiology Studies: No results found.  Scheduled Meds: . amLODipine  5 mg Oral Daily  . baricitinib  4 mg Oral Daily  . Chlorhexidine Gluconate Cloth  6 each Topical Daily  . enoxaparin (LOVENOX) injection  40 mg Subcutaneous Q12H  . insulin aspart  0-15 Units Subcutaneous Q4H  . labetalol  10 mg Intravenous Once  . linagliptin  5 mg Oral Daily  . mouth rinse  15 mL Mouth Rinse BID  . methylPREDNISolone (SOLU-MEDROL) injection  40 mg Intravenous Q12H  . nystatin  5 mL Oral QID  . saline  1 application Each Nare Q6H  . senna-docusate  2 tablet Oral BID   Continuous Infusions:    LOS: 13 days   Time spent: 35 minutes.  Tyrone Nine, MD [facility] [email or web address] 06/02/2020, 11:53 AM

## 2020-06-02 NOTE — Progress Notes (Signed)
Patients bladder volume scanned after no output over entire shift. Volume was found to be 389. Encouraged patient to void. MD made aware. Will recheck within 30 minutes if patient has not voided will perform in and out catheter.

## 2020-06-02 NOTE — Plan of Care (Signed)
  Problem: Clinical Measurements: Goal: Ability to maintain clinical measurements within normal limits will improve Outcome: Progressing   Problem: Activity: Goal: Risk for activity intolerance will decrease Outcome: Progressing   

## 2020-06-02 NOTE — Evaluation (Signed)
Physical Therapy Evaluation Patient Details Name: Teresa Gillespie MRN: [id number] DOB: 1946-04-22 Today's Date: 06/02/2020   History of Present Illness  LAYKEN BEG is an 74 y.o. female with a history of HTN and obesity and admitted for Acute hypoxemic respiratory failure due to covid-19 pneumonia: SARS-CoV-2 PCR positive on 11/25.  Clinical Impression  Pt admitted with above diagnosis.  Pt currently with functional limitations due to the deficits listed below (see PT Problem List). Pt will benefit from skilled PT to increase their independence and safety with mobility to allow discharge to the venue listed below.  Pt requiring encouragement to perform OOB today however agreeable for transfer to recliner.  Pt on HFNC and partial NRB during session and at rest with coughing, saturations decrease to 60%, and pt requires a few minutes to improve to 80s.  SpO2 ranging 60-89% during session.       Follow Up Recommendations Home health PT;Supervision - Intermittent    Equipment Recommendations  None recommended by PT    Recommendations for Other Services       Precautions / Restrictions Precautions Precautions: Fall Precaution Comments: monitor sats; currently on 15L HFNC and 30L 80% partial NRB      Mobility  Bed Mobility Overal bed mobility: Needs Assistance Bed Mobility: Supine to Sit     Supine to sit: Min assist;HOB elevated     General bed mobility comments: assist for upper body, managing lines/tubing    Transfers Overall transfer level: Needs assistance Equipment used: 2 person hand held assist Transfers: Sit to/from [employer] Sit to Stand: Min guard Stand pivot transfers: Min guard       General transfer comment: pt only agreeable to OOB to recliner today with encouragement, Spo2 dropped to 70%, cues for relaxation and breathing throughout session  Ambulation/Gait                Stairs            Wheelchair Mobility     Modified Rankin (Stroke Patients Only)       Balance                                             Pertinent Vitals/Pain Pain Assessment: No/denies pain    Home Living Family/patient expects to be discharged to:: Private residence Living Arrangements: Spouse/significant other;Children Available Help at Discharge: Available 24 hours/day Type of Home: House Home Access: Stairs to enter   [employer] of Steps: 2 Home Layout: One level Home Equipment: Environmental consultant - 2 wheels;Shower seat;Cane - single point      Prior Function Level of Independence: Independent               Hand Dominance   Dominant Hand: Right    Extremity/Trunk Assessment        Lower Extremity Assessment Lower Extremity Assessment: Overall WFL for tasks assessed    Cervical / Trunk Assessment Cervical / Trunk Assessment: Normal  Communication   Communication: HOH  Cognition Arousal/Alertness: Awake/alert Behavior During Therapy: Flat affect Overall Cognitive Status: Within Functional Limits for tasks assessed                                        General Comments General comments (skin integrity, edema, etc.): patient  fluctuate between 83%-90% during seated ther ex    Exercises General Exercises - Upper Extremity Shoulder Flexion: Strengthening;Both;10 reps;Seated;Theraband Theraband Level (Shoulder Flexion): Level 1 (Yellow) Shoulder Horizontal ABduction: Strengthening;10 reps;Seated;Theraband;Both Theraband Level (Shoulder Horizontal Abduction): Level 1 (Yellow) Elbow Flexion: Strengthening;Both;10 reps;Seated;Theraband Theraband Level (Elbow Flexion): Level 1 (Yellow)   Assessment/Plan    PT Assessment Patient needs continued PT services  PT Problem List Decreased strength;Decreased mobility;Decreased activity tolerance;Decreased balance;Cardiopulmonary status limiting activity       PT Treatment Interventions DME  instruction;Therapeutic exercise;Gait training;Balance training;Functional mobility training;Therapeutic activities;Patient/family education    PT Goals (Current goals can be found in the Care Plan section)  Acute Rehab PT Goals Patient Stated Goal: to return home with improved independence PT Goal Formulation: With patient Time For Goal Achievement: 06/16/20 Potential to Achieve Goals: Fair    Frequency Min 3X/week   Barriers to discharge        Co-evaluation               AM-PAC PT "6 Clicks" Mobility  Outcome Measure Help needed turning from your back to your side while in a flat bed without using bedrails?: A Little Help needed moving from lying on your back to sitting on the side of a flat bed without using bedrails?: A Little Help needed moving to and from a bed to a chair (including a wheelchair)?: A Little Help needed standing up from a chair using your arms (e.g., wheelchair or bedside chair)?: A Little Help needed to walk in hospital room?: A Little Help needed climbing 3-5 steps with a railing? : A Lot 6 Click Score: 17    End of Session Equipment Utilized During Treatment: Oxygen Activity Tolerance: Patient tolerated treatment well Patient left: in chair;with call bell/phone within reach;with nursing/sitter in room (pt on alarm pad however no alarm box in room, RN aware) Nurse Communication: Mobility status PT Visit Diagnosis: Other abnormalities of gait and mobility (R26.89)    Time: 1210-1250 PT Time Calculation (min) (ACUTE ONLY): 40 min   Charges:   PT Evaluation $PT Eval Moderate Complexity: 1 Mod        Kati PT, DPT Acute Rehabilitation Services Pager: [phone] Office: [phone]  Maida Sale E 06/02/2020, 3:36 PM

## 2020-06-02 NOTE — Progress Notes (Signed)
Occupational Therapy Treatment Patient Details Name: Teresa Gillespie MRN: [id number] DOB: 05-30-1946 Today's Date: 06/02/2020    History of present illness Teresa Gillespie is an 74 y.o. female with a history of HTN and obesity and admitted for Acute hypoxemic respiratory failure due to covid-19 pneumonia: SARS-CoV-2 PCR positive on 11/25.   OT comments  Upon arrival patient seated in chair with eyes closed. Agreeable to theraband UB exercises to maximize activity tolerance necessary for participation in self care tasks. Patient requires extensive rest breaks between exercises, attempted use of music as relaxation however patient states hard to hear over the oxygen. Patient fluctuating between 82%-90% throughout session on 30L 80% FIO2 and partial rebreather mask.    Follow Up Recommendations  Home health OT    Equipment Recommendations  3 in 1 bedside commode       Precautions / Restrictions Precautions Precautions: Fall Precaution Comments: monitor sats; currently on 30L 80% partial NRB       Mobility Bed Mobility               General bed mobility comments: in chair                      Cognition Arousal/Alertness: Awake/alert Behavior During Therapy: Flat affect Overall Cognitive Status: Within Functional Limits for tasks assessed                                          Exercises Exercises: General Upper Extremity;Other exercises General Exercises - Upper Extremity Shoulder Flexion: Strengthening;Both;10 reps;Seated;Theraband Theraband Level (Shoulder Flexion): Level 1 (Yellow) Shoulder Horizontal ABduction: Strengthening;10 reps;Seated;Theraband;Both Theraband Level (Shoulder Horizontal Abduction): Level 1 (Yellow) Elbow Flexion: Strengthening;Both;10 reps;Seated;Theraband Theraband Level (Elbow Flexion): Level 1 (Yellow)      General Comments patient fluctuate between 83%-90% during seated ther ex    Pertinent Vitals/  Pain       Pain Assessment: No/denies pain         Frequency  Min 2X/week        Progress Toward Goals  OT Goals(current goals can now be found in the care plan section)  Progress towards OT goals: Progressing toward goals  Acute Rehab OT Goals Patient Stated Goal: to return home with improved independence OT Goal Formulation: With patient Time For Goal Achievement: 06/13/20 Potential to Achieve Goals: Good ADL Goals Pt Will Perform Lower Body Dressing: with supervision Pt Will Transfer to Toilet: with supervision;ambulating;regular height toilet Pt Will Perform Toileting - Clothing Manipulation and hygiene: with supervision;sit to/from stand Additional ADL Goal #1: Patient will perform 10 min functional activity or exercise activity as evidence of improving activity tolerance Additional ADL Goal #2: Patient will demonstrate ability to perform breathing techniques to maintain o2 saturation prior to discharge  Plan Discharge plan remains appropriate       AM-PAC OT "6 Clicks" Daily Activity     Outcome Measure   Help from another person eating meals?: None Help from another person taking care of personal grooming?: A Little Help from another person toileting, which includes using toliet, bedpan, or urinal?: A Little Help from another person bathing (including washing, rinsing, drying)?: A Little Help from another person to put on and taking off regular upper body clothing?: A Little Help from another person to put on and taking off regular lower body clothing?: A Lot 6 Click Score: 18  End of Session Equipment Utilized During Treatment: Oxygen  OT Visit Diagnosis: Unsteadiness on feet (R26.81);Muscle weakness (generalized) (M62.81)   Activity Tolerance Patient limited by fatigue   Patient Left in chair;with call bell/phone within reach           Time: 8381-8403 OT Time Calculation (min): 20 min  Charges: OT General Charges $OT Visit: 1 Visit OT  Treatments $Therapeutic Exercise: 8-22 mins  Marlyce Huge OT OT pager: [phone]   Carmelia Roller 06/02/2020, 2:48 PM

## 2020-06-02 NOTE — Plan of Care (Signed)
  Problem: Education: Goal: Knowledge of risk factors and measures for prevention of condition will improve Outcome: Progressing   Problem: Coping: Goal: Psychosocial and spiritual needs will be supported Outcome: Progressing   Problem: Elimination: Goal: Will not experience complications related to bowel motility Outcome: Progressing

## 2020-06-02 NOTE — Progress Notes (Signed)
Pt refused another straight catheter after dayshift RN tried x2, will continue to check bladder scans and monitor output, as patient states "she will go in a little bit, but does not want another straight cath done now"

## 2020-06-02 NOTE — Progress Notes (Signed)
No output after 30 more minutes. In and out catherization performed. Failed attempt X2. Will find other staff to attempt.

## 2020-06-03 LAB — COMPREHENSIVE METABOLIC PANEL
ALT: 62 U/L — ABNORMAL HIGH (ref 0–44)
AST: 37 U/L (ref 15–41)
Albumin: 3.1 g/dL — ABNORMAL LOW (ref 3.5–5.0)
Alkaline Phosphatase: 64 U/L (ref 38–126)
Anion gap: 10 (ref 5–15)
BUN: 24 mg/dL — ABNORMAL HIGH (ref 8–23)
CO2: 31 mmol/L (ref 22–32)
Calcium: 8.7 mg/dL — ABNORMAL LOW (ref 8.9–10.3)
Chloride: 96 mmol/L — ABNORMAL LOW (ref 98–111)
Creatinine, Ser: 0.76 mg/dL (ref 0.44–1.00)
GFR, Estimated: >60 mL/min (ref >60)
Glucose, Bld: 120 mg/dL — ABNORMAL HIGH (ref 70–99)
Potassium: 4.3 mmol/L (ref 3.5–5.1)
Sodium: 137 mmol/L (ref 135–145)
Total Bilirubin: 1.2 mg/dL (ref 0.3–1.2)
Total Protein: 6.8 g/dL (ref 6.5–8.1)

## 2020-06-03 LAB — GLUCOSE, CAPILLARY
Glucose-Capillary: 118 mg/dL — ABNORMAL HIGH (ref 70–99)
Glucose-Capillary: 113 mg/dL — ABNORMAL HIGH (ref 70–99)
Glucose-Capillary: 118 mg/dL — ABNORMAL HIGH (ref 70–99)
Glucose-Capillary: 179 mg/dL — ABNORMAL HIGH (ref 70–99)
Glucose-Capillary: 115 mg/dL — ABNORMAL HIGH (ref 70–99)

## 2020-06-03 LAB — CBC WITH DIFFERENTIAL/PLATELET
Abs Immature Granulocytes: 0.08 10*3/uL — ABNORMAL HIGH (ref 0.00–0.07)
Basophils Absolute: 0 10*3/uL (ref 0.0–0.1)
Basophils Relative: 0 %
Eosinophils Absolute: 0 10*3/uL (ref 0.0–0.5)
Eosinophils Relative: 0 %
HCT: 48.3 % — ABNORMAL HIGH (ref 36.0–46.0)
Hemoglobin: 15.5 g/dL — ABNORMAL HIGH (ref 12.0–15.0)
Immature Granulocytes: 1 %
Lymphocytes Relative: 9 %
Lymphs Abs: 1 10*3/uL (ref 0.7–4.0)
MCH: 26.6 pg (ref 26.0–34.0)
MCHC: 32.1 g/dL (ref 30.0–36.0)
MCV: 82.8 fL (ref 80.0–100.0)
Monocytes Absolute: 0.5 10*3/uL (ref 0.1–1.0)
Monocytes Relative: 5 %
Neutro Abs: 9.4 10*3/uL — ABNORMAL HIGH (ref 1.7–7.7)
Neutrophils Relative %: 85 %
Platelets: 163 10*3/uL (ref 150–400)
RBC: 5.83 MIL/uL — ABNORMAL HIGH (ref 3.87–5.11)
RDW: 15.3 % (ref 11.5–15.5)
WBC: 11 10*3/uL — ABNORMAL HIGH (ref 4.0–10.5)
nRBC: 0 % (ref 0.0–0.2)

## 2020-06-03 LAB — D-DIMER, QUANTITATIVE: D-Dimer, Quant: 2.72 ug/mL-FEU — ABNORMAL HIGH (ref 0.00–0.50)

## 2020-06-03 MED ORDER — MENTHOL 3 MG MT LOZG
1.0000 | LOZENGE | OROMUCOSAL | Status: DC | PRN
  Filled 2020-06-03: qty 9

## 2020-06-03 MED ORDER — PHENOL 1.4 % MT LIQD: 1.0000 | OROMUCOSAL | Status: DC | PRN

## 2020-06-03 NOTE — Progress Notes (Signed)
Pt voided in bedside commode, no straight cath needed.

## 2020-06-03 NOTE — Plan of Care (Signed)
  Problem: Education: Goal: Knowledge of risk factors and measures for prevention of condition will improve Outcome: Progressing   Problem: Respiratory: Goal: Will maintain a patent airway Outcome: Progressing   Problem: Clinical Measurements: Goal: Respiratory complications will improve Outcome: Progressing   Problem: Nutrition: Goal: Adequate nutrition will be maintained Outcome: Progressing   Problem: Safety: Goal: Ability to remain free from injury will improve Outcome: Progressing

## 2020-06-03 NOTE — TOC Progression Note (Signed)
Transition of Care [facility]) - Progression Note    Patient Details  Name: Teresa Gillespie MRN: [id number] Date of Birth: December 18, 1945  Transition of Care [facility]) CM/SW Contact  Golda Acre, RN Phone Number: 06/03/2020, 8:41 AM  Clinical Narrative:    74 y.o. female with a history of HTN and obesity who fell ill after a funeral on 11/23 and presented to [facility] 11/25 with severe dyspnea saturating 50% on room air. SARS-CoV-2 PCR was positive with CXR showing bilateral patchy opacities. Labs also indicated acute liver injury and acute renal failure. She was admitted to [facility] SDU requiring heated high flow oxygen and continues to do so. Fortunately, respiratory effort has allowed her to avoid intubation.  PLAN: return to home with family for support Following for progression Expected Discharge Plan: Home/Self Care Barriers to Discharge: No Barriers Identified  Expected Discharge Plan and Services Expected Discharge Plan: Home/Self Care   Discharge Planning Services: CM Consult   Living arrangements for the past 2 months: Single Family Home                                       Social Determinants of Health (SDOH) Interventions    Readmission Risk Interventions No flowsheet data found.

## 2020-06-03 NOTE — Progress Notes (Signed)
PROGRESS NOTE  Teresa Gillespie  TIR:[id number] DOB: [birth year] DOA: 04/27/2020 PCP: Benita Stabile, MD   Brief Narrative: Teresa Gillespie is a 74 y.o. female with a history of HTN and obesity who fell ill after a funeral on 11/23 and presented to [facility] 11/25 with severe dyspnea saturating 50% on room air. SARS-CoV-2 PCR was positive with CXR showing bilateral patchy opacities. Labs also indicated acute liver injury and acute renal failure. She was admitted to [facility] SDU requiring heated high flow oxygen and continues to do so. Fortunately, respiratory effort has allowed her to avoid intubation.  Assessment & Plan: Active Problems:   Acute hypoxemic respiratory failure due to COVID-19 [facility])   AKI (acute kidney injury) (HCC)   Transaminasemia   Hyperkalemia  Acute hypoxemic respiratory failure due to covid-19 pneumonia: SARS-CoV-2 PCR positive on 11/25.  - Continue heated high flow and NRB to maintain adequate oxygen saturation and to facilitate mobility as much as possible.   - s/p remdesivir x5 days (11/27 - 12/1) - Continue steroids. CRP normal as of 12/4. - S/p course of levaquin empirically for elevated PCT on admission. PCT negative 12/7.  - Continue baricitinib (started 11/28, last dose would be 12/11)  - Encourage OOB, IS, FV, and awake proning if able - Continue airborne, contact precautions for 21 days from positive testing. - Monitor CMP and inflammatory markers - Enoxaparin 40mg  IV q12h with inability to perform CTA chest and elevated d-dimer. Venous LE U/S negative for DVT. Echo with mildly elevated PASP but normal RV size and function. D-dimer coming down slightly, stable <3.   Acute renal failure: Multifactorial related to poor po intake, decreased EABV, and prolonged/severe hypoxia/covid. Renal U/S unremarkable.  - Improved with IVF, creatinine normalized from 2.79 on admission. No peripheral edema and crackles have improved. Not continuing lasix currently.  Acute liver  injury, hepatic steatosis: Seen on U/S. Hx prior cholecystectomy without biliary dilatation. LFTs improving.  - Continue monitoring LFTs intermittently, this AM ALT stable and AST normalized.  T2DM with steroid-induced hyperglycemia: HbA1c 6.6% indicated mild diabetes.  - Continue SSI - remains well-controlled - Continue (new) linagliptin  Hyponatremia: Improved.   Thrombocytopenia: Resolved.   HTN:  - Continue norvasc, avoid hypotension. BP stable.  Obesity: Estimated body mass index is 36.98 kg/m as calculated from the following:   Height as of this encounter: 5' (1.524 m).   Weight as of this encounter: 85.9 kg.  DVT prophylaxis: Lovenox Code Status: Full Family Communication: None at bedside. Spoke with daughter by phone 12/9. Disposition Plan:  Status is: Inpatient  Remains inpatient appropriate because:Inpatient level of care appropriate due to severity of illness  Dispo: The patient is from: Home              Anticipated d/c is to: TBD              Anticipated d/c date is: > 3 days              Patient currently is not medically stable to d/c.  Consultants:   PCCM  Procedures:   None  Antimicrobials:  Remdesivir 11/27 - 12/1  Levaquin 11/25 - 11/28  Subjective: Shortness of breath mild at rest, moderate-severe with exertion improved with oxygen supplementation which remains stable. No chest pain or other complaints. Occasionally having limited UOP with retention, though making a normal amount of urine per pt. Eating and drinking fairly.   Objective: Vitals:   06/03/20 0700 06/03/20 0800 06/03/20 0826 06/03/20  0952  BP: (!) 161/73 (!) 152/77  126/81  Pulse: 82 80  85  Resp:      Temp:  97.9 F (36.6 C)    TempSrc:  Oral    SpO2: (!) 84% (!) 85% (!) 86% (!) 88%  Weight:      Height:        Intake/Output Summary (Last 24 hours) at 06/03/2020 1023 Last data filed at 06/03/2020 0600 Gross per 24 hour  Intake --  Output 650 ml  Net -650 ml   Filed  Weights   2020-06-17 1900  Weight: 85.9 kg   Gen: 74 y.o. female in no distress Pulm: Nonlabored stable tachypnea without accessory muscle use with HHF + NRB. Moving better air, still crackles. CV: Regular rate and rhythm. No murmur, rub, or gallop. No JVD, no dependent edema. GI: Abdomen soft, non-tender, non-distended, with normoactive bowel sounds.  Ext: Warm, no deformities Skin: No rashes, lesions or ulcers on visualized skin. Neuro: Alert and oriented. No focal neurological deficits. Psych: Judgement and insight appear fair. Mood euthymic & affect congruent. Behavior is appropriate.    Data Reviewed: I have personally reviewed following labs and imaging studies  CBC: Recent Labs  Lab 05/28/20 0246 06/03/20 0845  WBC 14.1* 11.0*  NEUTROABS  --  9.4*  HGB 16.9* 15.5*  HCT 54.0* 48.3*  MCV 85.0 82.8  PLT 178 163   Basic Metabolic Panel: Recent Labs  Lab 05/28/20 0246 05/29/20 0300 05/30/20 0330 06/03/20 0845  NA 138 133* 136 137  K 4.2 4.1 4.3 4.3  CL 98 94* 96* 96*  CO2 21* 26 30 31   GLUCOSE 167* 172* 119* 120*  BUN 35* 32* 30* 24*  CREATININE 0.83 0.80 0.74 0.76  CALCIUM 8.3* 8.2* 8.3* 8.7*   GFR: Estimated Creatinine Clearance: 60.1 mL/min (by C-G formula based on SCr of 0.76 mg/dL). Liver Function Tests: Recent Labs  Lab 05/28/20 0246 05/29/20 0300 05/30/20 0330 06/03/20 0845  AST 29 28 30  37  ALT 77* 63* 60* 62*  ALKPHOS 54 55 53 64  BILITOT 1.0 1.2 1.2 1.2  PROT 7.0 6.5 6.4* 6.8  ALBUMIN 3.2* 3.0* 3.1* 3.1*   No results for input(s): LIPASE, AMYLASE in the last 168 hours. No results for input(s): AMMONIA in the last 168 hours. Coagulation Profile: No results for input(s): INR, PROTIME in the last 168 hours. Cardiac Enzymes: No results for input(s): CKTOTAL, CKMB, CKMBINDEX, TROPONINI in the last 168 hours. BNP (last 3 results) No results for input(s): PROBNP in the last 8760 hours. HbA1C: No results for input(s): HGBA1C in the last 72  hours. CBG: Recent Labs  Lab 06/02/20 1226 06/02/20 1635 06/02/20 1932 06/03/20 0447 06/03/20 0753  GLUCAP 111* 192* 136* 118* 113*   Lipid Profile: No results for input(s): CHOL, HDL, LDLCALC, TRIG, CHOLHDL, LDLDIRECT in the last 72 hours. Thyroid Function Tests: No results for input(s): TSH, T4TOTAL, FREET4, T3FREE, THYROIDAB in the last 72 hours. Anemia Panel: No results for input(s): VITAMINB12, FOLATE, FERRITIN, TIBC, IRON, RETICCTPCT in the last 72 hours. Urine analysis:    Component Value Date/Time   COLORURINE YELLOW 02/21/2017 1133   APPEARANCEUR HAZY (A) 02/21/2017 1133   LABSPEC 1.025 02/21/2017 1133   PHURINE 5.0 02/21/2017 1133   GLUCOSEU NEGATIVE 02/21/2017 1133   HGBUR NEGATIVE 02/21/2017 1133   BILIRUBINUR NEGATIVE 02/21/2017 1133   KETONESUR NEGATIVE 02/21/2017 1133   PROTEINUR NEGATIVE 02/21/2017 1133   UROBILINOGEN 0.2 02/28/2010 1254   NITRITE NEGATIVE 02/21/2017 1133  LEUKOCYTESUR LARGE (A) 02/21/2017 1133   No results found for this or any previous visit (from the past 240 hour(s)).    Radiology Studies: No results found.  Scheduled Meds: . amLODipine  5 mg Oral Daily  . baricitinib  4 mg Oral Daily  . Chlorhexidine Gluconate Cloth  6 each Topical Daily  . enoxaparin (LOVENOX) injection  40 mg Subcutaneous Q12H  . insulin aspart  0-15 Units Subcutaneous Q4H  . labetalol  10 mg Intravenous Once  . linagliptin  5 mg Oral Daily  . mouth rinse  15 mL Mouth Rinse BID  . methylPREDNISolone (SOLU-MEDROL) injection  40 mg Intravenous Q12H  . nystatin  5 mL Oral QID  . saline  1 application Each Nare Q6H  . senna-docusate  2 tablet Oral BID   Continuous Infusions:    LOS: 14 days   Time spent: 35 minutes.  Tyrone Nine, MD [facility] [email or web address] 06/03/2020, 10:23 AM

## 2020-06-03 NOTE — Progress Notes (Signed)
Case reviewed with Dr. Jarvis Newcomer, patient remains stable. PCCM will be available if needed.  Please call back if new needs arise.   Canary Brim, MSN, NP-C, AGACNP-BC [facility] 06/03/2020, 8:27 AM   Please see [email or web address] for pager details.

## 2020-06-04 ENCOUNTER — Inpatient Hospital Stay (HOSPITAL_COMMUNITY): Payer: Medicare Other

## 2020-06-04 LAB — GLUCOSE, CAPILLARY
Glucose-Capillary: 132 mg/dL — ABNORMAL HIGH (ref 70–99)
Glucose-Capillary: 136 mg/dL — ABNORMAL HIGH (ref 70–99)
Glucose-Capillary: 138 mg/dL — ABNORMAL HIGH (ref 70–99)
Glucose-Capillary: 141 mg/dL — ABNORMAL HIGH (ref 70–99)
Glucose-Capillary: 153 mg/dL — ABNORMAL HIGH (ref 70–99)
Glucose-Capillary: 176 mg/dL — ABNORMAL HIGH (ref 70–99)
Glucose-Capillary: 217 mg/dL — ABNORMAL HIGH (ref 70–99)
Glucose-Capillary: 78 mg/dL (ref 70–99)

## 2020-06-04 MED ORDER — FUROSEMIDE 10 MG/ML IJ SOLN
40.0000 mg | Freq: Every day | INTRAMUSCULAR | Status: AC
  Administered 2020-06-04 – 2020-06-05 (×2): 40 mg via INTRAVENOUS
  Filled 2020-06-04 (×2): qty 4

## 2020-06-04 NOTE — Progress Notes (Signed)
OT Cancellation Note  Patient Details Name: Teresa Gillespie MRN: [id number] DOB: 04-14-1946   Cancelled Treatment:    Reason Eval/Treat Not Completed: Medical issues which prohibited therapy RN ask to hold today. Will re-attempt over weekend as schedule allows.  Marlyce Huge OT OT pager: [phone]   Carmelia Roller 06/04/2020, 12:00 PM

## 2020-06-04 NOTE — Progress Notes (Signed)
Pt had significant episode of desaturation, SPO2 got as low as 17%, pt had taken all forms of oxygen off her face (heated high-flow, partial non-rebreather) this RN immediately maxed out heated high-flow, alerted respiratory and respiratory switched her from partial rebreather to full non-rebreather, pt now at 50L 100% and stable, notified NP about situation, will continue to monitor closely and educated pt on importance of keeping oxygen on at all times.

## 2020-06-04 NOTE — Progress Notes (Signed)
PROGRESS NOTE  Teresa Gillespie  ZOX:[id number] DOB: May 26, 1946 DOA: 05/09/2020 PCP: Benita Stabile, MD   Brief Narrative: Teresa Gillespie is a 74 y.o. female with a history of HTN and obesity who fell ill after a funeral on 11/23 and presented to [facility] 11/25 with severe dyspnea saturating 50% on room air. SARS-CoV-2 PCR was positive with CXR showing bilateral patchy opacities. Labs also indicated acute liver injury and acute renal failure. She was admitted to [facility] SDU requiring heated high flow oxygen and continues to do so. Fortunately, respiratory effort has allowed her to avoid intubation.  Assessment & Plan: Active Problems:   Acute hypoxemic respiratory failure due to COVID-19 [facility])   AKI (acute kidney injury) (HCC)   Transaminasemia   Hyperkalemia  Acute hypoxemic respiratory failure due to covid-19 pneumonia: SARS-CoV-2 PCR positive on 11/25.  - Continue heated high flow and NRB to maintain adequate oxygen saturation and to facilitate mobility as much as possible. CXR this AM demonstrating some subcutaneous emphysema without PTX suspected to be related to pulmonary parenchymal injury. This portends a worse prognosis and certainly suggests that mechanical ventilation would not likely be helpful.  - Pt is as close to ICU nurses station as possible and under constant surveillance there. I fear if she takes off oxygen supplementation again, she runs the risk of cardiac arrest. She remains full code based on my discussions with patient and daughter.  - Repeat trial of lasix given small pleural effusion on my personal review of CXR today. - s/p remdesivir x5 days (11/27 - 12/1) - Continue steroids. CRP normal as of 12/4. - S/p course of levaquin empirically for elevated PCT on admission. PCT negative 12/7.  - Continue baricitinib (started 11/28, last dose planned be 12/11)  - Encourage OOB, IS, FV, and awake proning if able - Continue airborne, contact precautions for 21 days from  positive testing. - Monitor CMP and inflammatory markers - Enoxaparin 40mg  IV q12h with inability to perform CTA chest and elevated d-dimer. Venous LE U/S negative for DVT. Echo with mildly elevated PASP but normal RV size and function. D-dimer coming down slightly, stable <3.   Acute renal failure: Multifactorial related to poor po intake, decreased EABV, and prolonged/severe hypoxia/covid. Renal U/S unremarkable.  - Improved with IVF, creatinine normalized from 2.79 on admission. Having some edema and pleural effusion, will repeat 2 day trial of lasix.   Acute liver injury, hepatic steatosis: Seen on U/S. Hx prior cholecystectomy without biliary dilatation. LFTs improving.  - Continue monitoring LFTs intermittently, ALT stable and AST normalized.  T2DM with steroid-induced hyperglycemia: HbA1c 6.6% indicated mild diabetes.  - Continue SSI - remains well-controlled - Continue (new) linagliptin  Hyponatremia: Improved.   Thrombocytopenia: Resolved.   HTN:  - Continue norvasc, avoid hypotension. BP stable.  Obesity: Estimated body mass index is 36.98 kg/m as calculated from the following:   Height as of this encounter: 5' (1.524 m).   Weight as of this encounter: 85.9 kg.  DVT prophylaxis: Lovenox Code Status: Full Family Communication: None at bedside. Spoke with daughter by phone 12/9. Disposition Plan:  Status is: Inpatient  Remains inpatient appropriate because:Inpatient level of care appropriate due to severity of illness  Dispo: The patient is from: Home              Anticipated d/c is to: TBD              Anticipated d/c date is: > 3 days  Patient currently is not medically stable to d/c.  Consultants:   PCCM  Procedures:   None  Antimicrobials:  Remdesivir 11/27 - 12/1  Levaquin 11/25 - 11/28  Subjective: Overnight was found having taken all oxygen off and SpO2 reportedly 17% which improved with reapplication of oxygen. This morning she doesn't  remember taking it off, was sleeping at the time. Feels about the same as she did yesterday at this time.   Objective: Vitals:   06/04/20 0600 06/04/20 0700 06/04/20 0800 06/04/20 0826  BP: (!) 146/77 (!) 152/70    Pulse: 75 77    Resp:      Temp:   98.4 F (36.9 C)   TempSrc:   Oral   SpO2: 91% 92%  (!) 88%  Weight:      Height:        Intake/Output Summary (Last 24 hours) at 06/04/2020 0851 Last data filed at 06/04/2020 0500 Gross per 24 hour  Intake 250 ml  Output 300 ml  Net -50 ml   Filed Weights   05/07/2020 1900  Weight: 85.9 kg   Gen: 74 y.o. female in no distress Pulm: Nonlabored, unchanged tachypnea with HHF + NRB. Mild crackles. CV: Regular rate and rhythm. No murmur, rub, or gallop. No JVD, trace dependent edema. GI: Abdomen soft, non-tender, non-distended, with normoactive bowel sounds.  Ext: Warm, no deformities Skin: No rashes, lesions or ulcers on visualized skin. Neuro: Alert and oriented. No focal neurological deficits. Psych: Judgement and insight appear fair. Mood euthymic & affect congruent. Behavior is appropriate.    Data Reviewed: I have personally reviewed following labs and imaging studies  CBC: Recent Labs  Lab 06/03/20 0845  WBC 11.0*  NEUTROABS 9.4*  HGB 15.5*  HCT 48.3*  MCV 82.8  PLT 163   Basic Metabolic Panel: Recent Labs  Lab 05/29/20 0300 05/30/20 0330 06/03/20 0845  NA 133* 136 137  K 4.1 4.3 4.3  CL 94* 96* 96*  CO2 26 30 31   GLUCOSE 172* 119* 120*  BUN 32* 30* 24*  CREATININE 0.80 0.74 0.76  CALCIUM 8.2* 8.3* 8.7*   GFR: Estimated Creatinine Clearance: 60.1 mL/min (by C-G formula based on SCr of 0.76 mg/dL). Liver Function Tests: Recent Labs  Lab 05/29/20 0300 05/30/20 0330 06/03/20 0845  AST 28 30 37  ALT 63* 60* 62*  ALKPHOS 55 53 64  BILITOT 1.2 1.2 1.2  PROT 6.5 6.4* 6.8  ALBUMIN 3.0* 3.1* 3.1*   No results for input(s): LIPASE, AMYLASE in the last 168 hours. No results for input(s): AMMONIA in the  last 168 hours. Coagulation Profile: No results for input(s): INR, PROTIME in the last 168 hours. Cardiac Enzymes: No results for input(s): CKTOTAL, CKMB, CKMBINDEX, TROPONINI in the last 168 hours. BNP (last 3 results) No results for input(s): PROBNP in the last 8760 hours. HbA1C: No results for input(s): HGBA1C in the last 72 hours. CBG: Recent Labs  Lab 06/03/20 1557 06/03/20 2033 06/04/20 0042 06/04/20 0335 06/04/20 0817  GLUCAP 179* 115* 138* 141* 78   Lipid Profile: No results for input(s): CHOL, HDL, LDLCALC, TRIG, CHOLHDL, LDLDIRECT in the last 72 hours. Thyroid Function Tests: No results for input(s): TSH, T4TOTAL, FREET4, T3FREE, THYROIDAB in the last 72 hours. Anemia Panel: No results for input(s): VITAMINB12, FOLATE, FERRITIN, TIBC, IRON, RETICCTPCT in the last 72 hours. Urine analysis:    Component Value Date/Time   COLORURINE YELLOW 02/21/2017 1133   APPEARANCEUR HAZY (A) 02/21/2017 1133   LABSPEC 1.025 02/21/2017  1133   PHURINE 5.0 02/21/2017 1133   GLUCOSEU NEGATIVE 02/21/2017 1133   HGBUR NEGATIVE 02/21/2017 1133   BILIRUBINUR NEGATIVE 02/21/2017 1133   KETONESUR NEGATIVE 02/21/2017 1133   PROTEINUR NEGATIVE 02/21/2017 1133   UROBILINOGEN 0.2 02/28/2010 1254   NITRITE NEGATIVE 02/21/2017 1133   LEUKOCYTESUR LARGE (A) 02/21/2017 1133   No results found for this or any previous visit (from the past 240 hour(s)).    Radiology Studies: DG CHEST PORT 1 VIEW  Result Date: 06/04/2020 CLINICAL DATA:  COVID.  Respiratory failure. EXAM: PORTABLE CHEST 1 VIEW COMPARISON:  05/05/2020. FINDINGS: Mediastinum hilar structures normal. Heart size stable. Low lung volumes with basilar atelectasis. Diffuse bilateral interstitial infiltrates/edema noted. Small left pleural effusion. No pneumothorax. Surgical clips right upper quadrant. Degenerative change thoracic spine. Subcutaneous emphysema noted over the right neck and shoulder. IMPRESSION: 1. Low lung volumes with  basilar atelectasis. Diffuse bilateral interstitial infiltrates/edema noted. Small left pleural effusion. 2. Subcutaneous emphysema noted over the right neck and shoulder. No pneumothorax. Electronically Signed   By: Maisie Fus  Register   On: 06/04/2020 05:28    Scheduled Meds: . amLODipine  5 mg Oral Daily  . baricitinib  4 mg Oral Daily  . Chlorhexidine Gluconate Cloth  6 each Topical Daily  . enoxaparin (LOVENOX) injection  40 mg Subcutaneous Q12H  . furosemide  40 mg Intravenous Daily  . insulin aspart  0-15 Units Subcutaneous Q4H  . labetalol  10 mg Intravenous Once  . linagliptin  5 mg Oral Daily  . mouth rinse  15 mL Mouth Rinse BID  . methylPREDNISolone (SOLU-MEDROL) injection  40 mg Intravenous Q12H  . nystatin  5 mL Oral QID  . saline  1 application Each Nare Q6H  . senna-docusate  2 tablet Oral BID   Continuous Infusions:    LOS: 15 days   Time spent: 35 minutes.  Tyrone Nine, MD [facility] [email or web address] 06/04/2020, 8:51 AM

## 2020-06-04 NOTE — Progress Notes (Signed)
PT Cancellation Note  Patient Details Name: Teresa Gillespie MRN: [id number] DOB: 1946-05-18   Cancelled Treatment:    Reason Eval/Treat Not Completed: Medical issues which prohibited therapy, per  RN after desaturation this AM, patient to rest today.   Rada Hay 06/04/2020, 12:31 PM  Blanchard Kelch PT Acute Rehabilitation Services Pager [phone] Office [phone]

## 2020-06-05 LAB — RENAL FUNCTION PANEL
Albumin: 3.1 g/dL — ABNORMAL LOW (ref 3.5–5.0)
Anion gap: 14 (ref 5–15)
BUN: 30 mg/dL — ABNORMAL HIGH (ref 8–23)
CO2: 29 mmol/L (ref 22–32)
Calcium: 8.7 mg/dL — ABNORMAL LOW (ref 8.9–10.3)
Chloride: 96 mmol/L — ABNORMAL LOW (ref 98–111)
Creatinine, Ser: 0.58 mg/dL (ref 0.44–1.00)
GFR, Estimated: >60 mL/min (ref >60)
Glucose, Bld: 118 mg/dL — ABNORMAL HIGH (ref 70–99)
Phosphorus: 3.6 mg/dL (ref 2.5–4.6)
Potassium: 4.5 mmol/L (ref 3.5–5.1)
Sodium: 139 mmol/L (ref 135–145)

## 2020-06-05 LAB — GLUCOSE, CAPILLARY
Glucose-Capillary: 112 mg/dL — ABNORMAL HIGH (ref 70–99)
Glucose-Capillary: 124 mg/dL — ABNORMAL HIGH (ref 70–99)
Glucose-Capillary: 130 mg/dL — ABNORMAL HIGH (ref 70–99)
Glucose-Capillary: 133 mg/dL — ABNORMAL HIGH (ref 70–99)
Glucose-Capillary: 161 mg/dL — ABNORMAL HIGH (ref 70–99)

## 2020-06-05 LAB — D-DIMER, QUANTITATIVE: D-Dimer, Quant: 2.46 ug/mL-FEU — ABNORMAL HIGH (ref 0.00–0.50)

## 2020-06-05 MED ORDER — DEXAMETHASONE SODIUM PHOSPHATE 4 MG/ML IJ SOLN
4.0000 mg | Freq: Every day | INTRAMUSCULAR | Status: DC
  Administered 2020-06-06 – 2020-06-08 (×3): 4 mg via INTRAVENOUS
  Filled 2020-06-05 (×3): qty 1

## 2020-06-05 NOTE — Progress Notes (Signed)
PROGRESS NOTE  Teresa Gillespie  DXA:[id number] DOB: 02-12-1946 DOA: 2020/06/18 PCP: Benita Stabile, MD   Brief Narrative: Teresa Gillespie is a 74 y.o. female with a history of HTN and obesity who fell ill after a funeral on 11/23 and presented to [facility] 11/25 with severe dyspnea saturating 50% on room air. SARS-CoV-2 PCR was positive with CXR showing bilateral patchy opacities. Labs also indicated acute liver injury and acute renal failure. She was admitted to [facility] SDU requiring heated high flow oxygen and continues to do so. Fortunately, respiratory effort has allowed her to avoid intubation thus far. Hypoxemia remains severe. CXR revealing some pleural effusion for which lasix is intermittently administered.  Assessment & Plan: Active Problems:   Acute hypoxemic respiratory failure due to COVID-19 [facility])   AKI (acute kidney injury) (HCC)   Transaminasemia   Hyperkalemia  Acute hypoxemic respiratory failure due to covid-19 pneumonia: SARS-CoV-2 PCR positive on 11/25.  - Continue heated high flow and NRB to maintain adequate oxygen saturation and to facilitate mobility as much as possible. Goal >85% with normal respiratory effort. CXR 12/10 demonstrated subcutaneous emphysema without PTX suspected to be related to pulmonary parenchymal injury. This portends a worse prognosis and certainly suggests that mechanical ventilation may be harmful. - Pt is as close to ICU nurses station as possible and under constant surveillance there. I fear if she takes off oxygen supplementation again, she runs the risk of cardiac arrest.This was conveyed to the patient and her daughter. She remains full code. - Continue empiric lasix to maintain net negative balance. Good UOP 12/10 with stable/improved Cr.  - s/p remdesivir x5 days (11/27 - 12/1) - s/p baricitinib (11/28 - 12/11)  - s/p levaquin (11/25 - 11/28). PCT negative 12/7.  - Continue steroids, continue taper. CRP normal as of 12/4. - Encourage OOB, IS,  FV, and awake proning if able - Continue airborne, contact precautions for 21 days from positive testing. - Enoxaparin 40mg  IV q12h with inability to perform CTA chest and elevated d-dimer (improving). Venous LE U/S negative for DVT. Echo with mildly elevated PASP but normal RV size and function.    Acute renal failure: Multifactorial related to poor po intake, decreased EABV, and prolonged/severe hypoxia/covid. Renal U/S unremarkable.  - Improved with IVF, creatinine normalized from 2.79 on admission. Having some edema and pleural effusion, will repeat 2 day trial of lasix.   Acute liver injury, hepatic steatosis: Seen on U/S. Hx prior cholecystectomy without biliary dilatation. LFTs improving.  - Continue monitoring LFTs intermittently, ALT stable and AST normalized.  T2DM with steroid-induced hyperglycemia: HbA1c 6.6% indicated mild diabetes.  - Continue SSI - remains well-controlled - Continue (new) linagliptin  Hyponatremia: Improved.   Thrombocytopenia: Resolved.   HTN:  - Continue norvasc, avoid hypotension. BP stable.  Obesity: Estimated body mass index is 36.98 kg/m as calculated from the following:   Height as of this encounter: 5' (1.524 m).   Weight as of this encounter: 85.9 kg.  DVT prophylaxis: Lovenox Code Status: Full Family Communication: None at bedside. Spoke with daughter by phone 12/10. Disposition Plan:  Status is: Inpatient  Remains inpatient appropriate because:Inpatient level of care appropriate due to severity of illness  Dispo: The patient is from: Home              Anticipated d/c is to: TBD              Anticipated d/c date is: > 3 days  Patient currently is not medically stable to d/c.  Consultants:   PCCM  Procedures:   None  Antimicrobials:  Remdesivir 11/27 - 12/1  Levaquin 11/25 - 11/28  Subjective: No new complaints, still short of breath with exertion but remains in high spirits. No chest pain or leg swelling,  reports good UOP and good po intake over past 24 hours.   Objective: Vitals:   06/05/20 0500 06/05/20 0600 06/05/20 0746 06/05/20 0845  BP: 136/74 129/79 (!) 156/82   Pulse: 67 74  81  Resp:    (!) 26  Temp:      TempSrc:      SpO2: (!) 89% 91%  91%  Weight:      Height:        Intake/Output Summary (Last 24 hours) at 06/05/2020 1035 Last data filed at 06/05/2020 0446 Gross per 24 hour  Intake 360 ml  Output 1950 ml  Net -1590 ml   Filed Weights   05/28/20 1900  Weight: 85.9 kg   Gen: 74 y.o. female in no distress Pulm: Nonlabored but tachypneic with HHF + NRB, mildly labored when rising to seated position. Crackles. CV: Regular rate and rhythm. No murmur, rub, or gallop. No JVD, no dependent edema. GI: Abdomen soft, non-tender, non-distended, with normoactive bowel sounds.  Ext: Warm, no deformities Skin: No rashes, lesions or ulcers on visualized skin. Neuro: Alert and oriented. No focal neurological deficits. Psych: Judgement and insight appear fair. Mood euthymic & affect congruent. Behavior is appropriate.    Data Reviewed: I have personally reviewed following labs and imaging studies  CBC: Recent Labs  Lab 06/03/20 0845  WBC 11.0*  NEUTROABS 9.4*  HGB 15.5*  HCT 48.3*  MCV 82.8  PLT 163   Basic Metabolic Panel: Recent Labs  Lab 05/30/20 0330 06/03/20 0845 06/05/20 0236  NA 136 137 139  K 4.3 4.3 4.5  CL 96* 96* 96*  CO2 30 31 29   GLUCOSE 119* 120* 118*  BUN 30* 24* 30*  CREATININE 0.74 0.76 0.58  CALCIUM 8.3* 8.7* 8.7*  PHOS  --   --  3.6   GFR: Estimated Creatinine Clearance: 60.1 mL/min (by C-G formula based on SCr of 0.58 mg/dL). Liver Function Tests: Recent Labs  Lab 05/30/20 0330 06/03/20 0845 06/05/20 0236  AST 30 37  --   ALT 60* 62*  --   ALKPHOS 53 64  --   BILITOT 1.2 1.2  --   PROT 6.4* 6.8  --   ALBUMIN 3.1* 3.1* 3.1*   No results for input(s): LIPASE, AMYLASE in the last 168 hours. No results for input(s): AMMONIA in  the last 168 hours. Coagulation Profile: No results for input(s): INR, PROTIME in the last 168 hours. Cardiac Enzymes: No results for input(s): CKTOTAL, CKMB, CKMBINDEX, TROPONINI in the last 168 hours. BNP (last 3 results) No results for input(s): PROBNP in the last 8760 hours. HbA1C: No results for input(s): HGBA1C in the last 72 hours. CBG: Recent Labs  Lab 06/04/20 1533 06/04/20 1959 06/04/20 2350 06/05/20 0342 06/05/20 0743  GLUCAP 153* 132* 176* 133* 112*   Lipid Profile: No results for input(s): CHOL, HDL, LDLCALC, TRIG, CHOLHDL, LDLDIRECT in the last 72 hours. Thyroid Function Tests: No results for input(s): TSH, T4TOTAL, FREET4, T3FREE, THYROIDAB in the last 72 hours. Anemia Panel: No results for input(s): VITAMINB12, FOLATE, FERRITIN, TIBC, IRON, RETICCTPCT in the last 72 hours. Urine analysis:    Component Value Date/Time   COLORURINE YELLOW 02/21/2017 1133  APPEARANCEUR HAZY (A) 02/21/2017 1133   LABSPEC 1.025 02/21/2017 1133   PHURINE 5.0 02/21/2017 1133   GLUCOSEU NEGATIVE 02/21/2017 1133   HGBUR NEGATIVE 02/21/2017 1133   BILIRUBINUR NEGATIVE 02/21/2017 1133   KETONESUR NEGATIVE 02/21/2017 1133   PROTEINUR NEGATIVE 02/21/2017 1133   UROBILINOGEN 0.2 02/28/2010 1254   NITRITE NEGATIVE 02/21/2017 1133   LEUKOCYTESUR LARGE (A) 02/21/2017 1133   No results found for this or any previous visit (from the past 240 hour(s)).    Radiology Studies: DG CHEST PORT 1 VIEW  Result Date: 06/04/2020 CLINICAL DATA:  COVID.  Respiratory failure. EXAM: PORTABLE CHEST 1 VIEW COMPARISON:  04/27/2020. FINDINGS: Mediastinum hilar structures normal. Heart size stable. Low lung volumes with basilar atelectasis. Diffuse bilateral interstitial infiltrates/edema noted. Small left pleural effusion. No pneumothorax. Surgical clips right upper quadrant. Degenerative change thoracic spine. Subcutaneous emphysema noted over the right neck and shoulder. IMPRESSION: 1. Low lung volumes  with basilar atelectasis. Diffuse bilateral interstitial infiltrates/edema noted. Small left pleural effusion. 2. Subcutaneous emphysema noted over the right neck and shoulder. No pneumothorax. Electronically Signed   By: Maisie Fus  Register   On: 06/04/2020 05:28    Scheduled Meds:  amLODipine  5 mg Oral Daily   Chlorhexidine Gluconate Cloth  6 each Topical Daily   enoxaparin (LOVENOX) injection  40 mg Subcutaneous Q12H   insulin aspart  0-15 Units Subcutaneous Q4H   labetalol  10 mg Intravenous Once   linagliptin  5 mg Oral Daily   mouth rinse  15 mL Mouth Rinse BID   methylPREDNISolone (SOLU-MEDROL) injection  40 mg Intravenous Q12H   nystatin  5 mL Oral QID   saline  1 application Each Nare Q6H   senna-docusate  2 tablet Oral BID   Continuous Infusions:    LOS: 16 days   Time spent: 35 minutes.  Tyrone Nine, MD [facility] [email or web address] 06/05/2020, 10:35 AM

## 2020-06-06 LAB — GLUCOSE, CAPILLARY
Glucose-Capillary: 156 mg/dL — ABNORMAL HIGH (ref 70–99)
Glucose-Capillary: 91 mg/dL (ref 70–99)
Glucose-Capillary: 100 mg/dL — ABNORMAL HIGH (ref 70–99)
Glucose-Capillary: 160 mg/dL — ABNORMAL HIGH (ref 70–99)
Glucose-Capillary: 193 mg/dL — ABNORMAL HIGH (ref 70–99)
Glucose-Capillary: 143 mg/dL — ABNORMAL HIGH (ref 70–99)

## 2020-06-06 LAB — RENAL FUNCTION PANEL
Albumin: 3.1 g/dL — ABNORMAL LOW (ref 3.5–5.0)
Anion gap: 14 (ref 5–15)
BUN: 33 mg/dL — ABNORMAL HIGH (ref 8–23)
CO2: 30 mmol/L (ref 22–32)
Calcium: 8.4 mg/dL — ABNORMAL LOW (ref 8.9–10.3)
Chloride: 92 mmol/L — ABNORMAL LOW (ref 98–111)
Creatinine, Ser: 0.6 mg/dL (ref 0.44–1.00)
GFR, Estimated: >60 mL/min (ref >60)
Glucose, Bld: 83 mg/dL (ref 70–99)
Phosphorus: 3.7 mg/dL (ref 2.5–4.6)
Potassium: 3.7 mmol/L (ref 3.5–5.1)
Sodium: 136 mmol/L (ref 135–145)

## 2020-06-06 NOTE — Progress Notes (Signed)
PROGRESS NOTE  Teresa Gillespie  TGP:[id number] DOB: March 02, 1946 DOA: 08-Jun-2020 PCP: Benita Stabile, MD   Brief Narrative: Teresa Gillespie is a 74 y.o. female with a history of HTN and obesity who fell ill after a funeral on 11/23 and presented to [facility] 11/25 with severe dyspnea saturating 50% on room air. SARS-CoV-2 PCR was positive with CXR showing bilateral patchy opacities. Labs also indicated acute liver injury and acute renal failure. She was admitted to [facility] SDU requiring heated high flow oxygen and continues to do so. Fortunately, respiratory effort has allowed her to avoid intubation thus far. Hypoxemia remains severe. CXR revealing some pleural effusion for which lasix is intermittently administered.  Assessment & Plan: Active Problems:   Acute hypoxemic respiratory failure due to COVID-19 [facility])   AKI (acute kidney injury) (HCC)   Transaminasemia   Hyperkalemia  Acute hypoxemic respiratory failure due to covid-19 pneumonia: SARS-CoV-2 PCR positive on 11/25.  - Continue heated high flow and NRB to maintain adequate oxygen saturation and to facilitate mobility as much as possible. Goal >85% with normal respiratory effort. CXR 12/10 demonstrated subcutaneous emphysema without PTX suspected to be related to pulmonary parenchymal injury. This portends a worse prognosis and certainly suggests that mechanical ventilation may be harmful. - Pt is as close to ICU nurses station as possible and under constant surveillance there. I fear if she takes off oxygen supplementation again, she runs the risk of cardiac arrest. This was conveyed to the patient and her daughter. She remains full code. - Hold lasix for now with widening BUN:Cr, recheck CXR in AM.  - s/p remdesivir x5 days (11/27 - 12/1) - s/p baricitinib (11/28 - 12/11)  - s/p levaquin (11/25 - 11/28). PCT negative 12/7.  - Continue steroids, continue taper, currently decadron 4mg . CRP normal as of 12/4. - Encourage OOB, IS, FV, and  awake proning if able - Continue airborne, contact precautions for 21 days from positive testing. - Enoxaparin 40mg  IV q12h with inability to perform CTA chest and elevated d-dimer (improving). Venous LE U/S negative for DVT. Echo with mildly elevated PASP but normal RV size and function.    Acute renal failure: Multifactorial related to poor po intake, decreased EABV, and prolonged/severe hypoxia/covid. Renal U/S unremarkable.  - Improved with IVF, creatinine normalized from 2.79 on admission.   Acute liver injury, hepatic steatosis: Seen on U/S. Hx prior cholecystectomy without biliary dilatation. LFTs improving.  - Continue monitoring LFTs intermittently, ALT stable and AST normalized.  T2DM with steroid-induced hyperglycemia: HbA1c 6.6% indicated mild diabetes.  - Continue SSI, remains well-controlled - Continue (new) linagliptin  Hyponatremia: Improved.   Thrombocytopenia: Resolved.   HTN:  - Continue norvasc, avoid hypotension. BP stable.  Obesity: Estimated body mass index is 36.98 kg/m as calculated from the following:   Height as of this encounter: 5' (1.524 m).   Weight as of this encounter: 85.9 kg.  DVT prophylaxis: Lovenox Code Status: Full Family Communication: None at bedside. Spoke with daughter by phone 12/10. Disposition Plan:  Status is: Inpatient  Remains inpatient appropriate because:Inpatient level of care appropriate due to severity of illness  Dispo: The patient is from: Home              Anticipated d/c is to: TBD              Anticipated d/c date is: > 3 days              Patient currently is not medically stable  to d/c.  Consultants:   PCCM  Procedures:   None  Antimicrobials:  Remdesivir 11/27 - 12/1  Levaquin 11/25 - 11/28  Subjective: No complaints, though remains on HHF.Shortness of breath with exertion is severe, limiting ambulation considerably, constant, improved with rest. Getting up intermittently to recliner, eating ok,  drinking ok. No chest pain or leg swelling. No bleeding.   Objective: Vitals:   06/06/20 0800 06/06/20 0829 06/06/20 0900 06/06/20 1000  BP: (!) 145/68  128/75 132/79  Pulse: 98  88 (!) 102  Resp:      Temp: (!) 97.4 F (36.3 C)     TempSrc: Axillary     SpO2: 91% 92% 92% (!) 88%  Weight:      Height:        Intake/Output Summary (Last 24 hours) at 06/06/2020 1047 Last data filed at 06/05/2020 1943 Gross per 24 hour  Intake --  Output 1550 ml  Net -1550 ml   Filed Weights   2020-06-10 1900  Weight: 85.9 kg   Gen: 74 y.o. female in no distress Pulm: Nonlabored but tachypneic at rest, with any exertion worsening tachypnea, crackles stable, diminished. CV: Regular rate and rhythm. No murmur, rub, or gallop. No JVD, no dependent edema. GI: Abdomen soft, non-tender, non-distended, with normoactive bowel sounds.  Ext: Warm, no deformities Skin: No rashes, lesions or ulcers on visualized skin. Neuro: Alert and oriented. No focal neurological deficits. Psych: Judgement and insight appear fair. Mood euthymic & affect congruent. Behavior is appropriate.    Data Reviewed: I have personally reviewed following labs and imaging studies  CBC: Recent Labs  Lab 06/03/20 0845  WBC 11.0*  NEUTROABS 9.4*  HGB 15.5*  HCT 48.3*  MCV 82.8  PLT 163   Basic Metabolic Panel: Recent Labs  Lab 06/03/20 0845 06/05/20 0236 06/06/20 0249  NA 137 139 136  K 4.3 4.5 3.7  CL 96* 96* 92*  CO2 31 29 30   GLUCOSE 120* 118* 83  BUN 24* 30* 33*  CREATININE 0.76 0.58 0.60  CALCIUM 8.7* 8.7* 8.4*  PHOS  --  3.6 3.7   GFR: Estimated Creatinine Clearance: 60.1 mL/min (by C-G formula based on SCr of 0.6 mg/dL). Liver Function Tests: Recent Labs  Lab 06/03/20 0845 06/05/20 0236 06/06/20 0249  AST 37  --   --   ALT 62*  --   --   ALKPHOS 64  --   --   BILITOT 1.2  --   --   PROT 6.8  --   --   ALBUMIN 3.1* 3.1* 3.1*   No results for input(s): LIPASE, AMYLASE in the last 168 hours. No  results for input(s): AMMONIA in the last 168 hours. Coagulation Profile: No results for input(s): INR, PROTIME in the last 168 hours. Cardiac Enzymes: No results for input(s): CKTOTAL, CKMB, CKMBINDEX, TROPONINI in the last 168 hours. BNP (last 3 results) No results for input(s): PROBNP in the last 8760 hours. HbA1C: No results for input(s): HGBA1C in the last 72 hours. CBG: Recent Labs  Lab 06/05/20 1623 06/05/20 2027 06/05/20 2340 06/06/20 0432 06/06/20 0746  GLUCAP 130* 156* 124* 91 100*   Lipid Profile: No results for input(s): CHOL, HDL, LDLCALC, TRIG, CHOLHDL, LDLDIRECT in the last 72 hours. Thyroid Function Tests: No results for input(s): TSH, T4TOTAL, FREET4, T3FREE, THYROIDAB in the last 72 hours. Anemia Panel: No results for input(s): VITAMINB12, FOLATE, FERRITIN, TIBC, IRON, RETICCTPCT in the last 72 hours. Urine analysis:    Component Value  Date/Time   COLORURINE YELLOW 02/21/2017 1133   APPEARANCEUR HAZY (A) 02/21/2017 1133   LABSPEC 1.025 02/21/2017 1133   PHURINE 5.0 02/21/2017 1133   GLUCOSEU NEGATIVE 02/21/2017 1133   HGBUR NEGATIVE 02/21/2017 1133   BILIRUBINUR NEGATIVE 02/21/2017 1133   KETONESUR NEGATIVE 02/21/2017 1133   PROTEINUR NEGATIVE 02/21/2017 1133   UROBILINOGEN 0.2 02/28/2010 1254   NITRITE NEGATIVE 02/21/2017 1133   LEUKOCYTESUR LARGE (A) 02/21/2017 1133   No results found for this or any previous visit (from the past 240 hour(s)).    Radiology Studies: No results found.  Scheduled Meds: . amLODipine  5 mg Oral Daily  . Chlorhexidine Gluconate Cloth  6 each Topical Daily  . dexamethasone (DECADRON) injection  4 mg Intravenous Daily  . enoxaparin (LOVENOX) injection  40 mg Subcutaneous Q12H  . insulin aspart  0-15 Units Subcutaneous Q4H  . labetalol  10 mg Intravenous Once  . linagliptin  5 mg Oral Daily  . mouth rinse  15 mL Mouth Rinse BID  . nystatin  5 mL Oral QID  . saline  1 application Each Nare Q6H  . senna-docusate  2  tablet Oral BID   Continuous Infusions:    LOS: 17 days   Time spent: 35 minutes.  Tyrone Nine, MD [facility] [email or web address] 06/06/2020, 10:47 AM

## 2020-06-07 ENCOUNTER — Inpatient Hospital Stay (HOSPITAL_COMMUNITY): Payer: Medicare Other

## 2020-06-07 LAB — GLUCOSE, CAPILLARY
Glucose-Capillary: 116 mg/dL — ABNORMAL HIGH (ref 70–99)
Glucose-Capillary: 122 mg/dL — ABNORMAL HIGH (ref 70–99)
Glucose-Capillary: 127 mg/dL — ABNORMAL HIGH (ref 70–99)
Glucose-Capillary: 130 mg/dL — ABNORMAL HIGH (ref 70–99)
Glucose-Capillary: 144 mg/dL — ABNORMAL HIGH (ref 70–99)
Glucose-Capillary: 212 mg/dL — ABNORMAL HIGH (ref 70–99)
Glucose-Capillary: 92 mg/dL (ref 70–99)

## 2020-06-07 LAB — D-DIMER, QUANTITATIVE: D-Dimer, Quant: 2.42 ug/mL-FEU — ABNORMAL HIGH (ref 0.00–0.50)

## 2020-06-07 MED ORDER — FUROSEMIDE 10 MG/ML IJ SOLN
40.0000 mg | Freq: Every day | INTRAMUSCULAR | Status: DC
  Administered 2020-06-07: 09:00:00 40 mg via INTRAVENOUS
  Filled 2020-06-07: qty 4

## 2020-06-07 NOTE — Progress Notes (Signed)
Spiritual care rounding on pt due to LOS, Covid isolation.     Provided support and prayers at bedside.    Teresa Gillespie was receptive to chaplain presence.  Spoke of her home in [place] and hope that she will discharge.  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  She shared prayers with chaplain and welcomes follow up.  This chaplain will follow up for continued support in this lengthy hospitalization and isolation.

## 2020-06-07 NOTE — Plan of Care (Signed)
  Problem: Education: Goal: Knowledge of risk factors and measures for prevention of condition will improve Outcome: Progressing   Problem: Coping: Goal: Psychosocial and spiritual needs will be supported Outcome: Progressing   Problem: Respiratory: Goal: Will maintain a patent airway Outcome: Progressing Goal: Complications related to the disease process, condition or treatment will be avoided or minimized Outcome: Progressing   Problem: Education: Goal: Knowledge of General Education information will improve Description: Including pain rating scale, medication(s)/side effects and non-pharmacologic comfort measures Outcome: Progressing   Problem: Health Behavior/Discharge Planning: Goal: Ability to manage health-related needs will improve Outcome: Progressing   Problem: Clinical Measurements: Goal: Ability to maintain clinical measurements within normal limits will improve Outcome: Progressing Goal: Will remain free from infection Outcome: Progressing Goal: Diagnostic test results will improve Outcome: Progressing Goal: Respiratory complications will improve Outcome: Progressing Goal: Cardiovascular complication will be avoided Outcome: Progressing   Problem: Activity: Goal: Risk for activity intolerance will decrease Outcome: Progressing   Problem: Nutrition: Goal: Adequate nutrition will be maintained Outcome: Progressing   Problem: Coping: Goal: Level of anxiety will decrease Outcome: Progressing   Problem: Elimination: Goal: Will not experience complications related to bowel motility Outcome: Progressing   Problem: Safety: Goal: Ability to remain free from injury will improve Outcome: Progressing   

## 2020-06-07 NOTE — Progress Notes (Addendum)
PROGRESS NOTE  Teresa Gillespie  JOI:[id number] DOB: 1945/10/10 DOA: 06/16/2020 PCP: Benita Stabile, MD   Brief Narrative: Teresa Gillespie is a 74 y.o. female with a history of HTN and obesity who fell ill after a funeral on 11/23 and presented to [facility] 11/25 with severe dyspnea saturating 50% on room air. SARS-CoV-2 PCR was positive with CXR showing bilateral patchy opacities. Labs also indicated acute liver injury and acute renal failure. She was admitted to [facility] SDU requiring heated high flow oxygen and continues to do so. Fortunately, respiratory effort has allowed her to avoid intubation thus far. Hypoxemia remains severe. CXR revealing some pleural effusion for which lasix is being administered.  Assessment & Plan: Active Problems:   Acute hypoxemic respiratory failure due to COVID-19 [facility])   AKI (acute kidney injury) (HCC)   Transaminasemia   Hyperkalemia  Acute hypoxemic respiratory failure due to covid-19 pneumonia: SARS-CoV-2 PCR positive on 11/25.  - Continue heated high flow and NRB to maintain adequate oxygen saturation and to facilitate mobility as much as possible. Goal >85% with normal respiratory effort.  - Remains full code - Continue lasix given improvement in pulmonary edema on my personal review of CXR this AM. SubQ emphysema decreased as well.  - s/p remdesivir x5 days (11/27 - 12/1) - s/p baricitinib (11/28 - 12/11)  - s/p levaquin (11/25 - 11/28). PCT negative 12/7.  - Continue steroids, continue taper, currently decadron 4mg .  - Encourage OOB, IS, FV, and awake proning if able - Continue airborne, contact precautions for 21 days from positive testing. - Enoxaparin 40mg  IV q12h with inability to perform CTA chest and elevated d-dimer (improving). Venous LE U/S negative for DVT. Echo with mildly elevated PASP but normal RV size and function.    Acute renal failure: Multifactorial related to poor po intake, decreased EABV, and prolonged/severe hypoxia/covid. Renal  U/S unremarkable.  - Improved with IVF, creatinine normalized from 2.79 on admission.   Acute liver injury, hepatic steatosis: Seen on U/S. Hx prior cholecystectomy without biliary dilatation. LFTs improving.  - Continue monitoring LFTs intermittently, ALT stable and AST normalized.  T2DM with steroid-induced hyperglycemia: HbA1c 6.6% indicated mild diabetes.  - Continue SSI, remains well-controlled - Continue (new) linagliptin  Hyponatremia: Improved.   Thrombocytopenia: Resolved.   HTN:  - Continue norvasc, avoid hypotension. BP stable.  Obesity: Estimated body mass index is 36.98 kg/m as calculated from the following:   Height as of this encounter: 5' (1.524 m).   Weight as of this encounter: 85.9 kg.  DVT prophylaxis: Lovenox Code Status: Full Family Communication: None at bedside. No answer by daughter this PM. Disposition Plan:  Status is: Inpatient  Remains inpatient appropriate because:Inpatient level of care appropriate due to severity of illness  Dispo: The patient is from: Home              Anticipated d/c is to: TBD              Anticipated d/c date is: > 3 days              Patient currently is not medically stable to d/c.  Consultants:   PCCM  Procedures:   None  Antimicrobials:  Remdesivir 11/27 - 12/1  Levaquin 11/25 - 11/28  Subjective: Feels about the same, no major events over past 24 hours. Eating ok. She's getting to the recliner less and less often. No chest pain, leg swelling, hemoptysis.  Objective: Vitals:   06/07/20 0500 06/07/20 0600 06/07/20  0836 06/07/20 0900  BP: 137/68 133/72 130/78   Pulse: (!) 106 84 (!) 103   Resp: (!) 24  (!) 25   Temp:   (!) 96.4 F (35.8 C) (!) 97.3 F (36.3 C)  TempSrc:   Axillary Axillary  SpO2: (!) 84% 93% 94%   Weight:      Height:        Intake/Output Summary (Last 24 hours) at 06/07/2020 1208 Last data filed at 06/07/2020 0500 Gross per 24 hour  Intake 360 ml  Output 250 ml  Net 110 ml    Filed Weights   05/21/2020 1900  Weight: 85.9 kg   Gen: 74 y.o. female in no distress Pulm: Tachypneic at hypoxic despite HHF + NRB, crackles minimal, diminished throughout. CV: Regular rate and rhythm. No murmur, rub, or gallop. No JVD, no dependent edema. GI: Abdomen soft, non-tender, non-distended, with normoactive bowel sounds.  Ext: Warm, no deformities Skin: No rashes, lesions or ulcers on visualized skin. Neuro: Alert and oriented. No focal neurological deficits. Psych: Judgement and insight appear fair. Mood euthymic & affect congruent. Behavior is appropriate.    Data Reviewed: I have personally reviewed following labs and imaging studies  CBC: Recent Labs  Lab 06/03/20 0845  WBC 11.0*  NEUTROABS 9.4*  HGB 15.5*  HCT 48.3*  MCV 82.8  PLT 163   Basic Metabolic Panel: Recent Labs  Lab 06/03/20 0845 06/05/20 0236 06/06/20 0249  NA 137 139 136  K 4.3 4.5 3.7  CL 96* 96* 92*  CO2 31 29 30   GLUCOSE 120* 118* 83  BUN 24* 30* 33*  CREATININE 0.76 0.58 0.60  CALCIUM 8.7* 8.7* 8.4*  PHOS  --  3.6 3.7   GFR: Estimated Creatinine Clearance: 60.1 mL/min (by C-G formula based on SCr of 0.6 mg/dL). Liver Function Tests: Recent Labs  Lab 06/03/20 0845 06/05/20 0236 06/06/20 0249  AST 37  --   --   ALT 62*  --   --   ALKPHOS 64  --   --   BILITOT 1.2  --   --   PROT 6.8  --   --   ALBUMIN 3.1* 3.1* 3.1*   No results for input(s): LIPASE, AMYLASE in the last 168 hours. No results for input(s): AMMONIA in the last 168 hours. Coagulation Profile: No results for input(s): INR, PROTIME in the last 168 hours. Cardiac Enzymes: No results for input(s): CKTOTAL, CKMB, CKMBINDEX, TROPONINI in the last 168 hours. BNP (last 3 results) No results for input(s): PROBNP in the last 8760 hours. HbA1C: No results for input(s): HGBA1C in the last 72 hours. CBG: Recent Labs  Lab 06/06/20 1636 06/06/20 1940 06/07/20 0015 06/07/20 0329 06/07/20 0818  GLUCAP 193* 143*  122* 92 116*   Lipid Profile: No results for input(s): CHOL, HDL, LDLCALC, TRIG, CHOLHDL, LDLDIRECT in the last 72 hours. Thyroid Function Tests: No results for input(s): TSH, T4TOTAL, FREET4, T3FREE, THYROIDAB in the last 72 hours. Anemia Panel: No results for input(s): VITAMINB12, FOLATE, FERRITIN, TIBC, IRON, RETICCTPCT in the last 72 hours. Urine analysis:    Component Value Date/Time   COLORURINE YELLOW 02/21/2017 1133   APPEARANCEUR HAZY (A) 02/21/2017 1133   LABSPEC 1.025 02/21/2017 1133   PHURINE 5.0 02/21/2017 1133   GLUCOSEU NEGATIVE 02/21/2017 1133   HGBUR NEGATIVE 02/21/2017 1133   BILIRUBINUR NEGATIVE 02/21/2017 1133   KETONESUR NEGATIVE 02/21/2017 1133   PROTEINUR NEGATIVE 02/21/2017 1133   UROBILINOGEN 0.2 02/28/2010 1254   NITRITE NEGATIVE 02/21/2017 1133  LEUKOCYTESUR LARGE (A) 02/21/2017 1133   No results found for this or any previous visit (from the past 240 hour(s)).    Radiology Studies: DG CHEST PORT 1 VIEW  Result Date: 06/07/2020 CLINICAL DATA:  Respiratory failure. EXAM: PORTABLE CHEST 1 VIEW COMPARISON:  06/04/2020 FINDINGS: 0454 hours. Low lung volumes. The cardio pericardial silhouette is enlarged. There is pulmonary vascular congestion without overt pulmonary edema. Slight improvement in interstitial pulmonary edema pattern. Bones are demineralized. IMPRESSION: Low volume film with improvement in pulmonary edema pattern. Electronically Signed   By: Kennith Center M.D.   On: 06/07/2020 05:10    Scheduled Meds: . amLODipine  5 mg Oral Daily  . Chlorhexidine Gluconate Cloth  6 each Topical Daily  . dexamethasone (DECADRON) injection  4 mg Intravenous Daily  . enoxaparin (LOVENOX) injection  40 mg Subcutaneous Q12H  . furosemide  40 mg Intravenous Daily  . insulin aspart  0-15 Units Subcutaneous Q4H  . linagliptin  5 mg Oral Daily  . mouth rinse  15 mL Mouth Rinse BID  . nystatin  5 mL Oral QID  . saline  1 application Each Nare Q6H  .  senna-docusate  2 tablet Oral BID   Continuous Infusions:    LOS: 18 days   Time spent: 35 minutes.  Tyrone Nine, MD [facility] [email or web address] 06/07/2020, 12:08 PM

## 2020-06-07 NOTE — TOC Progression Note (Signed)
Transition of Care [facility]) - Progression Note    Patient Details  Name: Teresa Gillespie MRN: [id number] Date of Birth: [birth year]  [facility] Care [facility] CM/SW Contact  Golda Acre, RN Phone Number: 06/07/2020, 7:51 AM  Clinical Narrative:    Acute hypoxemic respiratory failure due to covid-19 pneumonia: SARS-CoV-2 PCR positive on 11/25.  - Continue heated high flow and NRB to maintain adequate oxygen saturation and to facilitate mobility as much as possible. Goal >85% with normal respiratory effort. CXR 12/10 demonstrated subcutaneous emphysema without PTX suspected to be related to pulmonary parenchymal injury. This portends a worse prognosis and certainly suggests that mechanical ventilation may be harmful. - Pt is as close to ICU nurses station as possible and under constant surveillance there. I fear if she takes off oxygen supplementation again, she runs the risk of cardiac arrest. This was conveyed to the patient and her daughter. She remains full code. - Hold lasix for now with widening BUN:Cr, recheck CXR in AM.  - s/p remdesivir x5 days (11/27 - 12/1) - s/p baricitinib (11/28 - 12/11)  - s/p levaquin (11/25 - 11/28). PCT negative 12/7.  - Continue steroids, continue taper, currently decadron 4mg . CRP normal as of 12/4. - Encourage OOB, IS, FV, and awake proning if able - Continue airborne, contact precautions for 21 days from positive testing. - Enoxaparin 40mg  IV q12h with inability to perform CTA chest and elevated d-dimer (improving). Venous LE U/S negative for DVT. Echo with mildly elevated PASP but normal RV size and function.    Acute renal failure: Multifactorial related to poor po intake, decreased EABV, and prolonged/severe hypoxia/covid. Renal U/S unremarkable.  - Improved with IVF, creatinine normalized from 2.79 on admission.  Plan undetermined due to condition. Following for progression: iv decadron, hfnc non rebreather mask at 50l/min, desats down to 84%  is a high risk for cardiac event due to hypoxia.   Expected Discharge Plan: Home/Self Care Barriers to Discharge: No Barriers Identified  Expected Discharge Plan and Services Expected Discharge Plan: Home/Self Care   Discharge Planning Services: CM Consult   Living arrangements for the past 2 months: Single Family Home                                       Social Determinants of Health (SDOH) Interventions    Readmission Risk Interventions No flowsheet data found.

## 2020-06-08 LAB — RENAL FUNCTION PANEL
Albumin: 3.5 g/dL (ref 3.5–5.0)
Anion gap: 22 — ABNORMAL HIGH (ref 5–15)
BUN: 53 mg/dL — ABNORMAL HIGH (ref 8–23)
CO2: 24 mmol/L (ref 22–32)
Calcium: 9 mg/dL (ref 8.9–10.3)
Chloride: 91 mmol/L — ABNORMAL LOW (ref 98–111)
Creatinine, Ser: 1.07 mg/dL — ABNORMAL HIGH (ref 0.44–1.00)
GFR, Estimated: 55 mL/min — ABNORMAL LOW (ref >60)
Glucose, Bld: 162 mg/dL — ABNORMAL HIGH (ref 70–99)
Phosphorus: 5.3 mg/dL — ABNORMAL HIGH (ref 2.5–4.6)
Potassium: 4.1 mmol/L (ref 3.5–5.1)
Sodium: 137 mmol/L (ref 135–145)

## 2020-06-08 LAB — GLUCOSE, CAPILLARY
Glucose-Capillary: 125 mg/dL — ABNORMAL HIGH (ref 70–99)
Glucose-Capillary: 143 mg/dL — ABNORMAL HIGH (ref 70–99)
Glucose-Capillary: 169 mg/dL — ABNORMAL HIGH (ref 70–99)

## 2020-06-08 LAB — D-DIMER, QUANTITATIVE: D-Dimer, Quant: 2.34 ug/mL-FEU — ABNORMAL HIGH (ref 0.00–0.50)

## 2020-06-08 MED ORDER — SODIUM CHLORIDE 0.9 % IV SOLN
8.0000 mg | INTRAVENOUS | Status: AC
  Administered 2020-06-08: 10:00:00 8 mg via INTRAVENOUS
  Filled 2020-06-08: qty 4

## 2020-06-08 MED ORDER — OXYMETAZOLINE HCL 0.05 % NA SOLN
1.0000 | Freq: Two times a day (BID) | NASAL | Status: DC
  Administered 2020-06-08 (×2): 1 via NASAL
  Filled 2020-06-08: qty 15

## 2020-06-08 MED ORDER — LORAZEPAM 2 MG/ML IJ SOLN: 0.5000 mg | INTRAMUSCULAR | Status: DC | PRN

## 2020-06-08 NOTE — Progress Notes (Signed)
Attending:    Subjective: Called back to evaluate her in the setting of new nose bleeding, concern for ability to protect airway Over the last few days her FiO2 has decreased slightly though she remains critically ill due to severe hpyoxemia from COVID 19.  Objective: Vitals:   06/08/20 0500 06/08/20 0600 06/08/20 0850 06/08/20 0921  BP: 140/84 122/84  (!) 135/94  Pulse: (!) 121 (!) 119    Resp:      Temp:    (!) 97 F (36.1 C)  TempSrc:    Axillary  SpO2: (!) 89% 90% (!) 87%   Weight:      Height:       FiO2 (%):  [60 %-80 %] 70 % No intake or output data in the 24 hours ending 06/08/20 1208  General:  Mild tachypnea, but otherwise resting comfortably in bed HENT: NCAT OP: some dried blood on soft palate and tongue, difficult to visualize her posterior pharynx despite repeated attempts PULM: Crackles bases B, increased effort with mouth care but returned to baseline after 2-3 minutes of rest CV: Tachycardic, no mgr GI: BS+, soft, nontender MSK: normal bulk and tone Neuro: awake, alert, no distress, MAEW    CBC    Component Value Date/Time   WBC 11.0 (H) 06/03/2020 0845   RBC 5.83 (H) 06/03/2020 0845   HGB 15.5 (H) 06/03/2020 0845   HCT 48.3 (H) 06/03/2020 0845   PLT 163 06/03/2020 0845   MCV 82.8 06/03/2020 0845   MCH 26.6 06/03/2020 0845   MCHC 32.1 06/03/2020 0845   RDW 15.3 06/03/2020 0845   LYMPHSABS 1.0 06/03/2020 0845   MONOABS 0.5 06/03/2020 0845   EOSABS 0.0 06/03/2020 0845   BASOSABS 0.0 06/03/2020 0845    BMET    Component Value Date/Time   NA 137 06/08/2020 0628   K 4.1 06/08/2020 0628   CL 91 (L) 06/08/2020 0628   CO2 24 06/08/2020 0628   GLUCOSE 162 (H) 06/08/2020 0628   BUN 53 (H) 06/08/2020 0628   CREATININE 1.07 (H) 06/08/2020 0628   CALCIUM 9.0 06/08/2020 0628   GFRNONAA 55 (L) 06/08/2020 0628   GFRAA >60 02/21/2017 1133    CXR images personally reviewed> interstitial edema in bases bilaterally  Impression/Plan: Acute respiratory  failure with hypoxemia due to COVID 19 pneumonia> agree with current management as you are doing, continue heated high flow oxygen, continue ICU monitoring  Nose bleed> seems like it has stopped at this point; would recommend afrin spray, if worsens then can consider TXA nebulized and packing (which I am happy to do); in regards to whether or not it is obstructing her airway: airway appears patent right now but I had a difficult time seeing her posterior pharynx, given her apparent discomfort with breathing/swallowing I think that having ENT take a look at her airway is a good idea because anything we can do to avoid intubation is helpful (mechanical ventilation would make her lungs worse at this point in her illness).  My cc time 35 minutes  Heber Hickory, MD [place] P[facility]M Pager: [phone] Cell: [phone] After 3pm or if no response, call [phone]

## 2020-06-08 NOTE — Progress Notes (Signed)
PROGRESS NOTE  Teresa Gillespie  DDU:[id number] DOB: [birth year] DOA: May 24, 2020 PCP: Benita Stabile, MD   Brief Narrative: Teresa Gillespie is a 74 y.o. female with a history of HTN and obesity who fell ill after a funeral on 11/23 and presented to [facility] 11/25 with severe dyspnea saturating 50% on room air. SARS-CoV-2 PCR was positive with CXR showing bilateral patchy opacities. Labs also indicated acute liver injury and acute renal failure. She was admitted to [facility] SDU requiring heated high flow oxygen and continues to do so. Fortunately, respiratory effort has allowed her to avoid intubation thus far. Hypoxemia remains severe. CXR revealing some pleural effusion for which lasix is being administered. On 12/14 she's developed epistaxis   Assessment & Plan: Active Problems:   Acute hypoxemic respiratory failure due to COVID-19 [facility])   AKI (acute kidney injury) (HCC)   Transaminasemia   Hyperkalemia  Acute hypoxemic respiratory failure due to covid-19 pneumonia: SARS-CoV-2 PCR positive on 11/25.  - Continue heated high flow and NRB to maintain adequate oxygen saturation and to facilitate mobility as much as possible. Goal >85% with normal respiratory effort.  - Remains full code - s/p remdesivir x5 days (11/27 - 12/1) - s/p baricitinib (11/28 - 12/11)  - s/p levaquin (11/25 - 11/28). PCT negative 12/7.  - Continue steroids, continue taper, currently decadron 4mg .  - Encourage OOB, IS, FV, and awake proning if able - Continue airborne, contact precautions for 21 days from positive testing. - Enoxaparin 40mg  IV q12h was given with elevated d-dimer, covid, and inability to perform CTA. Venous U/S negative for LE DVT. With epistaxis, will stop lovenox for now.  - Stop lasix with rise in creatinine  Epistaxis: Not actively bleeding, but clearly had large bleeding event with dried blood partially occluding nares and oropharynx. No stridor.  - D/w ENT, Dr. who recommended nasal  irrigation and to avoid packing with significant hypoxia. - D/w RN to continue nasal saline gel scheduled, add back afrin and provide oral irrigation to try to clear airway. Also d/w PCCM as there is some concern for losing airway.    Acute renal failure: Multifactorial related to poor po intake, decreased EABV, and prolonged/severe hypoxia/covid. Renal U/S unremarkable.  - Improved with IVF, creatinine normalized from 2.79 on admission.   Acute liver injury, hepatic steatosis: Seen on U/S. Hx prior cholecystectomy without biliary dilatation. LFTs improving.  - Continue monitoring LFTs intermittently, ALT stable and AST normalized.  T2DM with steroid-induced hyperglycemia: HbA1c 6.6% indicated mild diabetes.  - Continue SSI, remains well-controlled - Continue (new) linagliptin  Hyponatremia: Improved.   Thrombocytopenia: Resolved.   HTN:  - Continue norvasc, avoid hypotension. BP stable.  Obesity: Estimated body mass index is 36.98 kg/m as calculated from the following:   Height as of this encounter: 5' (1.524 m).   Weight as of this encounter: 85.9 kg.  DVT prophylaxis: Lovenox Code Status: Full Family Communication: None at bedside. No answer by daughter 12/13, will call later today. Disposition Plan:  Status is: Inpatient  Remains inpatient appropriate because:Inpatient level of care appropriate due to severity of illness  Dispo: The patient is from: Home              Anticipated d/c is to: TBD              Anticipated d/c date is: > 3 days              Patient currently is not medically stable to d/c.  Consultants:   PCCM  Procedures:   None  Antimicrobials:  Remdesivir 11/27 - 12/1  Levaquin 11/25 - 11/28  Subjective: Having some nausea and unable to completely communicate events of the evening but points to throat and has some dried blood in her nose. She is having more trouble breathing and has dried blood in her nose and throat.  Objective: Vitals:    06/08/20 0325 06/08/20 0400 06/08/20 0500 06/08/20 0600  BP:  122/68 140/84 122/84  Pulse: (!) 115 (!) 111 (!) 121 (!) 119  Resp: (!) 23     Temp: 98 F (36.7 C)     TempSrc: Axillary     SpO2: (!) 89% (!) 87% (!) 89% 90%  Weight:      Height:        Intake/Output Summary (Last 24 hours) at 06/08/2020 0848 Last data filed at 06/07/2020 1200 Gross per 24 hour  Intake --  Output 800 ml  Net -800 ml   Filed Weights   04/26/2020 1900  Weight: 85.9 kg   Gen: 74 y.o. female  HEENT: Dried blood in nares and black crusty dried blood in posterior oropharynx.  Pulm: Labored tachypnea with HHF and NRB which comes off for spitting out stomach contents, gagging at times. No stridor CV: Regular tachycardia. No murmur, rub, or gallop. No JVD, no dependent edema. GI: Abdomen soft, non-tender, non-distended, with normoactive bowel sounds.  Ext: Warm, no deformities Skin: No rashes, lesions or ulcers on visualized skin. Neuro: Alert and oriented. No focal neurological deficits. Psych: Limited by inability to speak. Mood anxious & affect congruent.   Data Reviewed: I have personally reviewed following labs and imaging studies  CBC: Recent Labs  Lab 06/03/20 0845  WBC 11.0*  NEUTROABS 9.4*  HGB 15.5*  HCT 48.3*  MCV 82.8  PLT 163   Basic Metabolic Panel: Recent Labs  Lab 06/03/20 0845 06/05/20 0236 06/06/20 0249 06/08/20 0628  NA 137 139 136 137  K 4.3 4.5 3.7 4.1  CL 96* 96* 92* 91*  CO2 31 29 30 24   GLUCOSE 120* 118* 83 162*  BUN 24* 30* 33* 53*  CREATININE 0.76 0.58 0.60 1.07*  CALCIUM 8.7* 8.7* 8.4* 9.0  PHOS  --  3.6 3.7 5.3*   GFR: Estimated Creatinine Clearance: 44.9 mL/min (A) (by C-G formula based on SCr of 1.07 mg/dL (H)). Liver Function Tests: Recent Labs  Lab 06/03/20 0845 06/05/20 0236 06/06/20 0249 06/08/20 0628  AST 37  --   --   --   ALT 62*  --   --   --   ALKPHOS 64  --   --   --   BILITOT 1.2  --   --   --   PROT 6.8  --   --   --   ALBUMIN  3.1* 3.1* 3.1* 3.5   No results for input(s): LIPASE, AMYLASE in the last 168 hours. No results for input(s): AMMONIA in the last 168 hours. Coagulation Profile: No results for input(s): INR, PROTIME in the last 168 hours. Cardiac Enzymes: No results for input(s): CKTOTAL, CKMB, CKMBINDEX, TROPONINI in the last 168 hours. BNP (last 3 results) No results for input(s): PROBNP in the last 8760 hours. HbA1C: No results for input(s): HGBA1C in the last 72 hours. CBG: Recent Labs  Lab 06/07/20 1639 06/07/20 2031 06/07/20 2340 06/08/20 0427 06/08/20 0809  GLUCAP 144* 127* 130* 125* 143*   Lipid Profile: No results for input(s): CHOL, HDL, LDLCALC, TRIG, CHOLHDL, LDLDIRECT  in the last 72 hours. Thyroid Function Tests: No results for input(s): TSH, T4TOTAL, FREET4, T3FREE, THYROIDAB in the last 72 hours. Anemia Panel: No results for input(s): VITAMINB12, FOLATE, FERRITIN, TIBC, IRON, RETICCTPCT in the last 72 hours. Urine analysis:    Component Value Date/Time   COLORURINE YELLOW 02/21/2017 1133   APPEARANCEUR HAZY (A) 02/21/2017 1133   LABSPEC 1.025 02/21/2017 1133   PHURINE 5.0 02/21/2017 1133   GLUCOSEU NEGATIVE 02/21/2017 1133   HGBUR NEGATIVE 02/21/2017 1133   BILIRUBINUR NEGATIVE 02/21/2017 1133   KETONESUR NEGATIVE 02/21/2017 1133   PROTEINUR NEGATIVE 02/21/2017 1133   UROBILINOGEN 0.2 02/28/2010 1254   NITRITE NEGATIVE 02/21/2017 1133   LEUKOCYTESUR LARGE (A) 02/21/2017 1133   No results found for this or any previous visit (from the past 240 hour(s)).    Radiology Studies: DG CHEST PORT 1 VIEW  Result Date: 06/07/2020 CLINICAL DATA:  Respiratory failure. EXAM: PORTABLE CHEST 1 VIEW COMPARISON:  06/04/2020 FINDINGS: 0454 hours. Low lung volumes. The cardio pericardial silhouette is enlarged. There is pulmonary vascular congestion without overt pulmonary edema. Slight improvement in interstitial pulmonary edema pattern. Bones are demineralized. IMPRESSION: Low volume  film with improvement in pulmonary edema pattern. Electronically Signed   By: Kennith Center M.D.   On: 06/07/2020 05:10    Scheduled Meds: . amLODipine  5 mg Oral Daily  . Chlorhexidine Gluconate Cloth  6 each Topical Daily  . dexamethasone (DECADRON) injection  4 mg Intravenous Daily  . enoxaparin (LOVENOX) injection  40 mg Subcutaneous Q12H  . insulin aspart  0-15 Units Subcutaneous Q4H  . linagliptin  5 mg Oral Daily  . mouth rinse  15 mL Mouth Rinse BID  . nystatin  5 mL Oral QID  . saline  1 application Each Nare Q6H  . senna-docusate  2 tablet Oral BID   Continuous Infusions: . ondansetron (ZOFRAN) IV       LOS: 19 days   Time spent: 35 minutes.  Tyrone Nine, MD [facility] [email or web address] 06/08/2020, 8:48 AM

## 2020-06-08 NOTE — Progress Notes (Signed)
Physical Therapy Treatment Patient Details Name: Teresa Gillespie MRN: [id number] DOB: 1945-07-02 Today's Date: 06/08/2020    History of Present Illness Teresa Gillespie is an 74 y.o. female with a history of HTN and obesity and admitted for Acute hypoxemic respiratory failure due to covid-19 pneumonia: SARS-CoV-2 PCR positive on 11/25.    PT Comments    The patient is very restless, gesturing that she does not want to sit up. Patient  Did assist with rolling for hygiene/linens changed. Patient is on PNRB and 40 L /70% Fio2 HHFNC, SPo2 dropping into low 80's with mobility. HR up to 120's.  Patient has not been able to progress due to respiratory  Status.  Follow Up Recommendations  SNF/LTACH     Equipment Recommendations  None recommended by PT    Recommendations for Other Services       Precautions / Restrictions Precautions Precautions: Fall Precaution Comments: monitor sats; currently on 15L PNRB and 40L 70% FIO2 hhfnc.    Mobility  Bed Mobility Overal bed mobility: Needs Assistance Bed Mobility: Rolling Rolling: Mod assist         General bed mobility comments: patient requires increased time and mod A to complete rolling of hips/ LEs to each side to change linens. Patient quite restless.  Transfers                 General transfer comment: Patient  unable  Ambulation/Gait                 Stairs             Wheelchair Mobility    Modified Rankin (Stroke Patients Only)       Balance                                            Cognition Arousal/Alertness: Lethargic Behavior During Therapy: Restless;Anxious Overall Cognitive Status: Difficult to assess                                 General Comments: patient gesturing, ? not wanting to mobilize. very restless.      Exercises      General Comments General comments (skin integrity, edema, etc.): NT      Pertinent Vitals/Pain Pain  Assessment: Faces Faces Pain Scale: Hurts a little bit Pain Location: general discomfort with bed mobility Pain Descriptors / Indicators: Discomfort Pain Intervention(s): Monitored during session;Limited activity within patient's tolerance    Home Living                      Prior Function            PT Goals (current goals can now be found in the care plan section) Acute Rehab PT Goals Patient Stated Goal: to return home with improved independence Progress towards PT goals: Not progressing toward goals - comment (due to respiratory status)    Frequency    Min 2X/week      PT Plan Discharge plan needs to be updated;Frequency needs to be updated    Co-evaluation              AM-PAC PT "6 Clicks" Mobility   Outcome Measure  Help needed turning from your back to your side while in a flat bed without using bedrails?: A  Lot Help needed moving from lying on your back to sitting on the side of a flat bed without using bedrails?: A Lot Help needed moving to and from a bed to a chair (including a wheelchair)?: A Lot Help needed standing up from a chair using your arms (e.g., wheelchair or bedside chair)?: Total Help needed to walk in hospital room?: Total Help needed climbing 3-5 steps with a railing? : Total 6 Click Score: 9    End of Session Equipment Utilized During Treatment: Oxygen Activity Tolerance: Patient limited by lethargy;Patient limited by fatigue Patient left: in bed;with call bell/phone within reach Nurse Communication: Mobility status PT Visit Diagnosis: Other abnormalities of gait and mobility (R26.89)     Time: 4627-0350 PT Time Calculation (min) (ACUTE ONLY): 31 min  Charges:  $Therapeutic Activity: 8-22 mins                     Teresa Gillespie PT Acute Rehabilitation Services Pager [phone] Office [phone]    Teresa Gillespie 06/08/2020, 1:02 PM

## 2020-06-08 NOTE — Progress Notes (Signed)
Extracted several dried blood clots from bilateral nares. Pt states relief from removing clots from nares.

## 2020-06-08 NOTE — Progress Notes (Addendum)
NAME:  Teresa Gillespie, MRN:  [id number], DOB:  1945-11-29, LOS: 19 ADMISSION DATE:  05/07/2020, CONSULTATION DATE:  12/2 REFERRING MD:  Jarvis Newcomer, CHIEF COMPLAINT:  Dyspnea   Brief History   74 y/o female admitted with acute hypoxemic respiratory failure on 11/25 due to COVID 19 pneumonia.  PCCM consulted on 12/2 in the setting of progressive dyspnea and hypoxemia despite treatment with remdesivir, baricitinib, and steroids.  PCCM signed off 12/9, and have been asked to see patient  12/14 for questionable airway due to nosebleed.  Past Medical History  Hypertension Obesity due to excess calories  Significant Hospital Events   11/25 Admission 12/06 Subjectively feels the same,35L flow / 90% FiO2  12/07 40L flow / 90% fiO2  Consults:  PCCM  Procedures:    Significant Diagnostic Tests:   ECHO 11/26 >> LVEF 55-60%, concentric LVH, PA systolic pressure estimate 37, dilated LA  Micro Data:  COVID 11/25 >> positive  BCx2 11/25 >> negative  Antimicrobials:  Levaquin 11/25 >> 11/28 Remdesivir 11/27 >> 12/1 Baricitinib 11/28 >>   Interim history/subjective:  Afebrile  On 40L flow / 70% FiO2, RT reports weaning O2 CBG's 125-145 Afebrile, T Max 98.6 Net Negative 3800 cc's, ( 800 cc UO last 24)   Objective   Blood pressure (!) 135/94, pulse (!) 119, temperature 98 F (36.7 C), temperature source Axillary, resp. rate (!) 23, height 5' (1.524 m), weight 85.9 kg, SpO2 (!) 87 %.    FiO2 (%):  [60 %-80 %] 70 %   Intake/Output Summary (Last 24 hours) at 06/08/2020 0923 Last data filed at 06/07/2020 1200 Gross per 24 hour  Intake --  Output 800 ml  Net -800 ml   Filed Weights   05/11/2020 1900  Weight: 85.9 kg    Examination: General: elderly adult female lying in bed in NAD HEENT: MM pink/moist,old blood on tongue , dried clots from nose, HFNC + NRB oxygen in place, anicteric  Neuro: AAOx4, speech clear, MAE CV: s1s2 RRR, no m/r/g, tachycardic PULM: RR of 22,   Bilateral chest excursion with posterior bibasilar faint crackles  GI: soft, NT, ND, BS +,Body mass index is 36.98 kg/m. Extremities: warm/dry, cool to touch, no edema  Skin: no rashes or lesions, cool to touch, dry and intact  Resolved Hospital Problem list     Assessment & Plan:   Acute hypoxemic respiratory failure due to ARDS from COVID 19 PNA Concern for HAP > doubt with normal PCT. New nose Bleed with blood clots in nose, some dried blood on patient tongue, concern for airway patency - CXR 12/13 with improved pulmonary edema pattern -continue baricitinib to complete 14 day course, renal dosing per pharmacy if needed - Continue Decadron 4 mg daily - ICU monitoring  - wean O2 for sats >85% at rest, 75% with exertion. Anticipate desaturation with exertion and slow recovery  -pt indicates she would be ok with short term intubation if needed, decision for such should be based on change in mental status, or physical evidence of ventilatory failure -push PT efforts, nutrition and sleep at night  -prone positioning as able  - Diuresis as renal function allows  Epistaxis Drop in HGB to 15.5 on 12/9  from 17.6 on 12/2 Concern for airway integrity Currently patent airway>> able to cough up clots High risk for  Intubation  Due to airway compromise/ airway occlusion Plan Check PT/ INR Consider ENT consult to visualize airway if situation worsens without need for sedation bronch would require.  Monitor for increase in bleeding Trend  CBC Ensure maximum humidification of HF oxygen Nasal irrigations to avoid packing at all possible Afrin nasal spray  1 spray each nare  BID AYR nasal gel each nare Q 6 hours  PCCM will follow for airway   Hyperglycemia -per primary > CBG's Q 4, SSI, linagliptin   Hypertension -per primary, amlodipine   Best practice (evaluated daily)  Per TRH.   PCCM re-consulted 12/14 2/2 nose bleed, and potential compromise of airway  Labs   CBC: Recent Labs   Lab 06/03/20 0845  WBC 11.0*  NEUTROABS 9.4*  HGB 15.5*  HCT 48.3*  MCV 82.8  PLT 163    Basic Metabolic Panel: Recent Labs  Lab 06/03/20 0845 06/05/20 0236 06/06/20 0249 06/08/20 0628  NA 137 139 136 137  K 4.3 4.5 3.7 4.1  CL 96* 96* 92* 91*  CO2 31 29 30 24   GLUCOSE 120* 118* 83 162*  BUN 24* 30* 33* 53*  CREATININE 0.76 0.58 0.60 1.07*  CALCIUM 8.7* 8.7* 8.4* 9.0  PHOS  --  3.6 3.7 5.3*   GFR: Estimated Creatinine Clearance: 44.9 mL/min (A) (by C-G formula based on SCr of 1.07 mg/dL (H)). Recent Labs  Lab 06/03/20 0845  WBC 11.0*    Liver Function Tests: Recent Labs  Lab 06/03/20 0845 06/05/20 0236 06/06/20 0249 06/08/20 0628  AST 37  --   --   --   ALT 62*  --   --   --   ALKPHOS 64  --   --   --   BILITOT 1.2  --   --   --   PROT 6.8  --   --   --   ALBUMIN 3.1* 3.1* 3.1* 3.5   No results for input(s): LIPASE, AMYLASE in the last 168 hours. No results for input(s): AMMONIA in the last 168 hours.  ABG    Component Value Date/Time   PHART 7.480 (H) 05/25/2020 1030   PCO2ART 36.6 05/25/2020 1030   PO2ART 59.2 (L) 05/25/2020 1030   HCO3 26.9 05/25/2020 1030   ACIDBASEDEF 2.4 (H) Jun 07, 2020 1330   O2SAT 87.9 05/25/2020 1030     Coagulation Profile: No results for input(s): INR, PROTIME in the last 168 hours.  Cardiac Enzymes: No results for input(s): CKTOTAL, CKMB, CKMBINDEX, TROPONINI in the last 168 hours.  HbA1C: Hgb A1c MFr Bld  Date/Time Value Ref Range Status  05/22/2020 07:26 AM 6.6 (H) 4.8 - 5.6 % Final    Comment:    RESULTS CONFIRMED BY MANUAL DILUTION (NOTE) Pre diabetes:          5.7%-6.4%  Diabetes:              >6.4%  Glycemic control for   <7.0% adults with diabetes     CBG: Recent Labs  Lab 06/07/20 1639 06/07/20 2031 06/07/20 2340 06/08/20 0427 06/08/20 0809  GLUCAP 144* 127* 130* 125* 143*   CC APP time 30 minutes  06/10/20, MSN, AGACNP-BC [facility] See Amion  for personal pager 06/08/2020, 9:23 AM

## 2020-06-08 NOTE — Progress Notes (Signed)
Patient O2 sat's desaturating in to 74-77%.  Patient repositioned and placed in high fowler's position. O2 increased to 60%HHFNC at 100% fiO2.  O2 sats slowly increased and is at 92% at this time. Will continue to monitor.

## 2020-06-08 NOTE — Progress Notes (Signed)
Occupational Therapy Treatment Patient Details Name: Teresa Gillespie MRN: [id number] DOB: 07-Jun-1946 Today's Date: 06/08/2020    History of present illness Teresa Gillespie is an 74 y.o. female with a history of HTN and obesity and admitted for Acute hypoxemic respiratory failure due to covid-19 pneumonia: SARS-CoV-2 PCR positive on 11/25.   OT comments  Provided max encouragement to attempt OOB mobility however patient shaking head no. Required encouragement to participate in bed mobility due to soiled linens and mod A to complete rolling hips/LEs in bed. Patient require increased time for all mobility and maximal assist for UB dressing of clean gown as patient with decreased strength, endurance to hold UEs up. Updated D/C recommendations as patient may need ST rehab prior to return home pending progress.   Follow Up Recommendations  SNF;Other (comment) (vs HH pending progress)    Equipment Recommendations  3 in 1 bedside commode       Precautions / Restrictions Precautions Precautions: Fall Precaution Comments: monitor sats; currently on 15L HHFNC and 40L 70% FIO2 partial RB       Mobility Bed Mobility Overal bed mobility: Needs Assistance Bed Mobility: Rolling Rolling: Mod assist         General bed mobility comments: patient requires increased time and mod A to complete rolling of hips/ LEs  Transfers                 General transfer comment: patient declined        ADL either performed or assessed with clinical judgement   ADL Overall ADL's : Needs assistance/impaired     Grooming: Wash/dry face;Bed level;Set up           Upper Body Dressing : Maximal assistance;Bed level Upper Body Dressing Details (indicate cue type and reason): patient minimally initiating lifting UEs to assist with donning clean gown         Toileting- Clothing Manipulation and Hygiene: Total assistance;Bed level Toileting - Clothing Manipulation Details (indicate cue  type and reason): patient incontinent of stool and pure wick leaked in bed, required increased encouragement to to perform bed mobility to change linens/perform peri care       General ADL Comments: patient with significantly decreased activity tolerance, repeatedly shakes her head no to trying to sit up EOB, to recliner               Cognition Arousal/Alertness: Awake/alert Behavior During Therapy: Flat affect Overall Cognitive Status: Difficult to assess                                                General Comments patient fluctuates in mid to high 80s on 40L 70% FIO2, partial rebreather mask    Pertinent Vitals/ Pain       Pain Assessment: Faces Faces Pain Scale: Hurts a little bit Pain Location: general discomfort with bed mobility Pain Descriptors / Indicators: Discomfort Pain Intervention(s): Monitored during session         Frequency  Min 2X/week        Progress Toward Goals  OT Goals(current goals can now be found in the care plan section)  Progress towards OT goals: Not progressing toward goals - comment (fatigue, increased O2 needs)  Acute Rehab OT Goals Patient Stated Goal: to return home with improved independence OT Goal Formulation: With patient Time For Goal Achievement: 06/13/20  Potential to Achieve Goals: Good ADL Goals Pt Will Perform Lower Body Dressing: with supervision Pt Will Transfer to Toilet: with supervision;ambulating;regular height toilet Pt Will Perform Toileting - Clothing Manipulation and hygiene: with supervision;sit to/from stand Additional ADL Goal #1: Patient will perform 10 min functional activity or exercise activity as evidence of improving activity tolerance Additional ADL Goal #2: Patient will demonstrate ability to perform breathing techniques to maintain o2 saturation prior to discharge  Plan Discharge plan needs to be updated       AM-PAC OT "6 Clicks" Daily Activity     Outcome Measure   Help  from another person eating meals?: None Help from another person taking care of personal grooming?: A Little Help from another person toileting, which includes using toliet, bedpan, or urinal?: Total Help from another person bathing (including washing, rinsing, drying)?: A Lot Help from another person to put on and taking off regular upper body clothing?: A Lot Help from another person to put on and taking off regular lower body clothing?: Total 6 Click Score: 13    End of Session Equipment Utilized During Treatment: Oxygen  OT Visit Diagnosis: Unsteadiness on feet (R26.81);Muscle weakness (generalized) (M62.81)   Activity Tolerance Patient limited by fatigue   Patient Left in bed;with call bell/phone within reach;with bed alarm set;with nursing/sitter in room   Nurse Communication Other (comment) (bed soiled)        Time: 2841-3244 OT Time Calculation (min): 31 min  Charges: OT General Charges $OT Visit: 1 Visit OT Treatments $Self Care/Home Management : 8-22 mins  Marlyce Huge OT OT pager: [phone]   Carmelia Roller 06/08/2020, 12:17 PM

## 2020-06-08 NOTE — Progress Notes (Addendum)
Encouraged patient to swish and swallow salt water to assist with airway clearance per MD,  Pt unable to tolerate. Pt had small emesis after trial. Zofran IV running at this time. Oral suction and hygiene performed. Medium size dried blood clots extracted from patient's nose. Afrin nasal spray applied.  Pt Oxygen at 86-89% at this time with HHFNC and PNRB mask.  Pt resting at this time. Will continue to monitor.

## 2020-06-08 NOTE — Progress Notes (Signed)
I've checked back on Mrs. Mellin several times today. She continues to look worse. Oxygen saturations frequently in low 80%'s despite maximal HHF and NRB support and respiratory rate in 40's. She appears anxious, writhing at times with her eyes closed. Her oropharynx has actually cleared out significantly from prior exam this morning.   We discussed her care, she says she's just having a bad day but she would like to start medication for anxiety. She adamantly confirms she would not consent to going on ventilator and we agree that were she to suffer a cardiac arrest in her current state, any resuscitative efforts would be futile. I spoke with both her and her daughter candidly that I felt we could be nearing end of life. Offered visitation by her family tonight as I fear she may suffer cardiac arrest or have progressive hypoxia tonight. She is DNR, DNI. Should would be amenable to trial of BiPAP, though I would only use this as bridge to allow for survival to visitation. Added ativan low dose prn. I don't believe we can transfer from her current room due to dependence on HHF at least until family visits with further goals of care discussions. Will DC non-comfort oriented medications and lab draws at this time.  Hazeline Junker, MD 06/08/2020 5:11 PM

## 2020-06-26 NOTE — Progress Notes (Signed)
Patient frequently desaturating since 1900. Most of the time, it is because patient is pulling off oxygen devices and other wires/leads. Patient is oriented x 4 and will allow devices/wires replaced after staff remind her of needs. Patient refuses BIPAP at this time. Will continue to monitor needs.

## 2020-06-26 NOTE — Progress Notes (Signed)
Family at bedside. Awaiting decision about funeral service provider.

## 2020-06-26 NOTE — Plan of Care (Signed)
Patient pronounced at 0200 by two Rn's. Problem: Education: Goal: Knowledge of risk factors and measures for prevention of condition will improve Outcome: Completed/Met   Problem: Coping: Goal: Psychosocial and spiritual needs will be supported Outcome: Completed/Met   Problem: Respiratory: Goal: Will maintain a patent airway Outcome: Completed/Met Goal: Complications related to the disease process, condition or treatment will be avoided or minimized Outcome: Completed/Met   Problem: Education: Goal: Knowledge of General Education information will improve Description: Including pain rating scale, medication(s)/side effects and non-pharmacologic comfort measures Outcome: Completed/Met   Problem: Health Behavior/Discharge Planning: Goal: Ability to manage health-related needs will improve Outcome: Completed/Met   Problem: Clinical Measurements: Goal: Ability to maintain clinical measurements within normal limits will improve Outcome: Completed/Met Goal: Will remain free from infection Outcome: Completed/Met Goal: Diagnostic test results will improve Outcome: Completed/Met Goal: Respiratory complications will improve Outcome: Completed/Met Goal: Cardiovascular complication will be avoided Outcome: Completed/Met   Problem: Activity: Goal: Risk for activity intolerance will decrease Outcome: Completed/Met   Problem: Nutrition: Goal: Adequate nutrition will be maintained Outcome: Completed/Met   Problem: Coping: Goal: Level of anxiety will decrease Outcome: Completed/Met   Problem: Elimination: Goal: Will not experience complications related to bowel motility Outcome: Completed/Met   Problem: Safety: Goal: Ability to remain free from injury will improve Outcome: Completed/Met

## 2020-06-26 NOTE — Death Summary Note (Signed)
DEATH SUMMARY   Patient Details  Name: Teresa Gillespie MRN: [id number] DOB: Jan 22, 1946  Admission/Discharge Information   Admit Date:  2020/05/23  Date of Death: Date of Death: Jun 12, 2020  Time of Death: Time of Death: 0200  Length of Stay: Oct 17, 2022  Referring Physician: Benita Stabile, MD   Reason(s) for Hospitalization  Respiratory failuredue to covid-19 pneumonia  Diagnoses  Preliminary cause of death: Acute hypoxic respiratory failure due to covid-19 pneumonia Secondary Diagnoses (including complications and co-morbidities):  Active Problems:   COVID-19   AKI (acute kidney injury) (HCC)   Transaminasemia   Hyperkalemia Goals of care discussion Type 2 diabetes mellitus, uncontrolled Steroid-induced hyperglycemia Pleural effusions Subcutaneous emphysema  Brief Hospital Course (including significant findings, care, treatment, and services provided and events leading to death)  Teresa Gillespie is a 75 y.o. female with a history of HTN and obesity who fell ill after a funeral on 11/23 and presented to [facility] 05/24/2023 with severe dyspnea saturating 50% on room air. SARS-CoV-2 PCR was positive with CXR showing bilateral patchy opacities. Labs also indicated acute liver injury and acute renal failure. She was admitted to [facility] requiring heated high flow oxygen. Hypoxia remained severe and progressive throughout a 20 day hospitalization despite antiviral and anti-inflammatory medications. Kidney function improved and liver function tests improved though she ultimately suffered progressive hypoxemia on 06/08/2020 at which time goals of care discussions were continued from earlier in the hospitalization. Family was allowed visitation and the patient was transitioned to comfort measures ultimately passing in the early morning of 2023-06-13.   Pertinent Labs and Studies  Significant Diagnostic Studies US Abdomen Complete  Result Date: 05/23/2020 CLINICAL DATA:  Initial evaluation for acute kidney  injury, elevated transaminases. EXAM: ABDOMEN ULTRASOUND COMPLETE COMPARISON:  Prior CT from 02/28/2010. FINDINGS: Gallbladder: Prior cholecystectomy. Common bile duct: Diameter: 2.6 mm Liver: No focal lesion identified. Diffusely increased echogenicity seen within the hepatic parenchyma. Portal vein is patent on color Doppler imaging with normal direction of blood flow towards the liver. IVC: No abnormality visualized. Pancreas: Visualized portion unremarkable. Spleen: Size and appearance within normal limits. Right Kidney: Length: 12.2 cm. Echogenicity within normal limits. No mass or hydronephrosis visualized. No nephrolithiasis. Left Kidney: Length: 11.2 cm. Echogenicity within normal limits. No mass or hydronephrosis visualized. No nephrolithiasis. Abdominal aorta: No aneurysm visualized. Other findings: None. IMPRESSION: 1. Diffusely increased echogenicity within the hepatic parenchyma, nonspecific, but most commonly seen with steatosis. 2. Prior cholecystectomy.  No biliary dilatation. 3. Normal sonographic evaluation of the kidneys. No hydronephrosis or other acute abnormality. Electronically Signed   By: Rise Mu M.D.   On: 05-23-20 22:47   DG CHEST PORT 1 VIEW  Result Date: 06/07/2020 CLINICAL DATA:  Respiratory failure. EXAM: PORTABLE CHEST 1 VIEW COMPARISON:  06/04/2020 FINDINGS: 0454 hours. Low lung volumes. The cardio pericardial silhouette is enlarged. There is pulmonary vascular congestion without overt pulmonary edema. Slight improvement in interstitial pulmonary edema pattern. Bones are demineralized. IMPRESSION: Low volume film with improvement in pulmonary edema pattern. Electronically Signed   By: Kennith Center M.D.   On: 06/07/2020 05:10   DG CHEST PORT 1 VIEW  Result Date: 06/04/2020 CLINICAL DATA:  COVID.  Respiratory failure. EXAM: PORTABLE CHEST 1 VIEW COMPARISON:  05-23-2020. FINDINGS: Mediastinum hilar structures normal. Heart size stable. Low lung volumes with  basilar atelectasis. Diffuse bilateral interstitial infiltrates/edema noted. Small left pleural effusion. No pneumothorax. Surgical clips right upper quadrant. Degenerative change thoracic spine. Subcutaneous emphysema noted over the right neck and  shoulder. IMPRESSION: 1. Low lung volumes with basilar atelectasis. Diffuse bilateral interstitial infiltrates/edema noted. Small left pleural effusion. 2. Subcutaneous emphysema noted over the right neck and shoulder. No pneumothorax. Electronically Signed   By: Maisie Fushomas  Register   On: 06/04/2020 05:28   DG Chest Port 1 View  Result Date: 05/10/2020 CLINICAL DATA:  Body aches since Tuesday, history hypertension EXAM: PORTABLE CHEST 1 VIEW COMPARISON:  Portable exam 1203 hours compared to 08/20/2014 FINDINGS: Borderline enlargement of cardiac silhouette. Mediastinal contours normal. Patchy BILATERAL pulmonary infiltrates, favor multifocal pneumonia. Minimal streaky atelectasis LEFT base. No pleural effusion or pneumothorax. Bones demineralized. IMPRESSION: Patchy BILATERAL pulmonary infiltrates question multifocal pneumonia. Electronically Signed   By: Ulyses SouthwardMark  Boles M.D.   On: 04/28/2020 12:14   ECHOCARDIOGRAM COMPLETE  Result Date: 05/21/2020    ECHOCARDIOGRAM REPORT   Patient Name:   Teresa Gillespie Date of Exam: 05/21/2020 Medical Rec #:  [id number]          Height:       60.0 in Accession #:    [id number]         Weight:       189.4 lb Date of Birth:  01-May-1946          BSA:          1.824 m Patient Age:    74 years           BP:           135/65 mmHg Patient Gender: F                  HR:           67 bpm. Exam Location:  Inpatient Procedure: 2D Echo, Color Doppler and Cardiac Doppler Indications:    R07.9* Chest pain, unspecified. Elevated troponin.  History:        Patient has no prior history of Echocardiogram examinations.                 Signs/Symptoms:Dyspnea and Shortness of Breath. Covid 19                 positive. Respiratory failure. Hypoxia.   Sonographer:    Sheralyn Boatmanina West RDCS Referring Phys: [phone] DAVID TAT  Sonographer Comments: Technically difficult study due to poor echo windows and patient is morbidly obese. Image acquisition challenging due to patient body habitus. IMPRESSIONS  1. Left ventricular ejection fraction, by estimation, is 55 to 60%. The left ventricle has normal function. The left ventricle has no regional wall motion abnormalities. There is moderate concentric left ventricular hypertrophy. Left ventricular diastolic parameters are consistent with Grade I diastolic dysfunction (impaired relaxation).  2. Right ventricular systolic function is normal. The right ventricular size is normal. There is mildly elevated pulmonary artery systolic pressure. The estimated right ventricular systolic pressure is 37.2 mmHg.  3. Left atrial size was mildly dilated.  4. The mitral valve is normal in structure. Trivial mitral valve regurgitation. No evidence of mitral stenosis.  5. The aortic valve is tricuspid. Aortic valve regurgitation is mild. Mild aortic valve sclerosis is present, with no evidence of aortic valve stenosis. FINDINGS  Left Ventricle: Left ventricular ejection fraction, by estimation, is 55 to 60%. The left ventricle has normal function. The left ventricle has no regional wall motion abnormalities. The left ventricular internal cavity size was normal in size. There is  moderate concentric left ventricular hypertrophy. Left ventricular diastolic parameters are consistent with Grade I diastolic dysfunction (impaired relaxation). Indeterminate filling  pressures. Right Ventricle: The right ventricular size is normal. No increase in right ventricular wall thickness. Right ventricular systolic function is normal. There is mildly elevated pulmonary artery systolic pressure. The tricuspid regurgitant velocity is 2.70  m/s, and with an assumed right atrial pressure of 8 mmHg, the estimated right ventricular systolic pressure is 37.2 mmHg. Left  Atrium: Left atrial size was mildly dilated. Right Atrium: Right atrial size was normal in size. Pericardium: There is no evidence of pericardial effusion. Mitral Valve: The mitral valve is normal in structure. Mild mitral annular calcification. Trivial mitral valve regurgitation. No evidence of mitral valve stenosis. Tricuspid Valve: The tricuspid valve is normal in structure. Tricuspid valve regurgitation is mild. Aortic Valve: The aortic valve is tricuspid. Aortic valve regurgitation is mild. Aortic regurgitation PHT measures 544 msec. Mild aortic valve sclerosis is present, with no evidence of aortic valve stenosis. Pulmonic Valve: The pulmonic valve was grossly normal. Pulmonic valve regurgitation is mild. Aorta: The aortic root and ascending aorta are structurally normal, with no evidence of dilitation. IAS/Shunts: No atrial level shunt detected by color flow Doppler.  LEFT VENTRICLE PLAX 2D LVIDd:         2.90 cm     Diastology LVIDs:         1.80 cm     LV e' medial:    5.00 cm/s LV PW:         1.80 cm     LV E/e' medial:  14.8 LV IVS:        1.70 cm     LV e' lateral:   6.09 cm/s LVOT diam:     1.90 cm     LV E/e' lateral: 12.1 LV SV:         62 LV SV Index:   34 LVOT Area:     2.84 cm  LV Volumes (MOD) LV vol d, MOD A2C: 65.1 ml LV vol d, MOD A4C: 67.6 ml LV vol s, MOD A2C: 19.0 ml LV vol s, MOD A4C: 19.5 ml LV SV MOD A2C:     46.1 ml LV SV MOD A4C:     67.6 ml LV SV MOD BP:      43.4 ml RIGHT VENTRICLE             IVC RV S prime:     13.80 cm/s  IVC diam: 1.40 cm TAPSE (M-mode): 2.2 cm LEFT ATRIUM             Index       RIGHT ATRIUM           Index LA diam:        3.80 cm 2.08 cm/m  RA Area:     16.50 cm LA Vol (A2C):   29.2 ml 16.01 ml/m RA Volume:   35.60 ml  19.52 ml/m LA Vol (A4C):   34.3 ml 18.81 ml/m LA Biplane Vol: 34.0 ml 18.64 ml/m  AORTIC VALVE             PULMONIC VALVE LVOT Vmax:   120.00 cm/s PR End Diast Vel: 1.95 msec LVOT Vmean:  74.600 cm/s LVOT VTI:    0.217 m AI PHT:      544  msec  AORTA Ao Root diam: 3.40 cm Ao Asc diam:  3.70 cm MITRAL VALVE               TRICUSPID VALVE MV Area (PHT): 3.85 cm    TR Peak grad:   29.2 mmHg  MV Decel Time: 197 msec    TR Vmax:        270.00 cm/s MV E velocity: 73.80 cm/s MV A velocity: 89.20 cm/s  SHUNTS MV E/A ratio:  0.83        Systemic VTI:  0.22 m                            Systemic Diam: 1.90 cm Rachelle Hora Croitoru MD Electronically signed by Thurmon Fair MD Signature Date/Time: 05/21/2020/12:07:00 PM    Final    VAS [place] LOWER EXTREMITY VENOUS (DVT)  Result Date: 05/24/2020  Lower Venous DVT Study Indications: Elevated Ddimer.  Risk Factors: COVID 19 positive. Comparison Study: No prior studies. Performing Technologist: Chanda Busing RVT  Examination Guidelines: A complete evaluation includes B-mode imaging, spectral Doppler, color Doppler, and power Doppler as needed of all accessible portions of each vessel. Bilateral testing is considered an integral part of a complete examination. Limited examinations for reoccurring indications may be performed as noted. The reflux portion of the exam is performed with the patient in reverse Trendelenburg.  +---------+---------------+---------+-----------+----------+--------------+ RIGHT    CompressibilityPhasicitySpontaneityPropertiesThrombus Aging +---------+---------------+---------+-----------+----------+--------------+ CFV      Full           Yes      Yes                                 +---------+---------------+---------+-----------+----------+--------------+ SFJ      Full                                                        +---------+---------------+---------+-----------+----------+--------------+ FV Prox  Full                                                        +---------+---------------+---------+-----------+----------+--------------+ FV Mid   Full                                                         +---------+---------------+---------+-----------+----------+--------------+ FV DistalFull                                                        +---------+---------------+---------+-----------+----------+--------------+ PFV      Full                                                        +---------+---------------+---------+-----------+----------+--------------+ POP      Full           Yes      Yes                                 +---------+---------------+---------+-----------+----------+--------------+  PTV      Full                                                        +---------+---------------+---------+-----------+----------+--------------+ PERO     Full                                                        +---------+---------------+---------+-----------+----------+--------------+   +---------+---------------+---------+-----------+----------+--------------+ LEFT     CompressibilityPhasicitySpontaneityPropertiesThrombus Aging +---------+---------------+---------+-----------+----------+--------------+ CFV      Full           Yes      Yes                                 +---------+---------------+---------+-----------+----------+--------------+ SFJ      Full                                                        +---------+---------------+---------+-----------+----------+--------------+ FV Prox  Full                                                        +---------+---------------+---------+-----------+----------+--------------+ FV Mid   Full                                                        +---------+---------------+---------+-----------+----------+--------------+ FV DistalFull                                                        +---------+---------------+---------+-----------+----------+--------------+ PFV      Full                                                         +---------+---------------+---------+-----------+----------+--------------+ POP      Full           Yes      Yes                                 +---------+---------------+---------+-----------+----------+--------------+ PTV      Full                                                        +---------+---------------+---------+-----------+----------+--------------+  PERO     Full                                                        +---------+---------------+---------+-----------+----------+--------------+     Summary: RIGHT: - There is no evidence of deep vein thrombosis in the lower extremity.  - No cystic structure found in the popliteal fossa.  LEFT: - There is no evidence of deep vein thrombosis in the lower extremity.  - No cystic structure found in the popliteal fossa.  *See table(s) above for measurements and observations. Electronically signed by Fabienne Bruns MD on 05/24/2020 at 3:57:05 PM.    Final     Microbiology No results found for this or any previous visit (from the past 240 hour(s)).  Lab Basic Metabolic Panel: Recent Labs  Lab 06/03/20 0845 06/05/20 0236 06/06/20 0249 06/08/20 0628  NA 137 139 136 137  K 4.3 4.5 3.7 4.1  CL 96* 96* 92* 91*  CO2 GLUCOSE 120* 118* 83 162*  BUN 24* 30* 33* 53*  CREATININE 0.76 0.58 0.60 1.07*  CALCIUM 8.7* 8.7* 8.4* 9.0  PHOS  --  3.6 3.7 5.3*   Liver Function Tests: Recent Labs  Lab 06/03/20 0845 06/05/20 0236 06/06/20 0249 06/08/20 0628  AST 37  --   --   --   ALT 62*  --   --   --   ALKPHOS 64  --   --   --   BILITOT 1.2  --   --   --   PROT 6.8  --   --   --   ALBUMIN 3.1* 3.1* 3.1* 3.5   No results for input(s): LIPASE, AMYLASE in the last 168 hours. No results for input(s): AMMONIA in the last 168 hours. CBC: Recent Labs  Lab 06/03/20 0845  WBC 11.0*  NEUTROABS 9.4*  HGB 15.5*  HCT 48.3*  MCV 82.8  PLT 163   Cardiac Enzymes: No results for input(s): CKTOTAL, CKMB, CKMBINDEX,  TROPONINI in the last 168 hours. Sepsis Labs: Recent Labs  Lab 06/03/20 0845  WBC 11.0*    Procedures/Operations  None   Tyrone Nine, MD 2020/06/21, 7:28 PM

## 2020-06-26 DEATH — deceased

## 2022-07-08 IMAGING — US US ABDOMEN COMPLETE
1 series · 14 of 25 positions shown · non-contrast
Comparison: Prior CT from 02/28/2010.

CLINICAL DATA: Initial evaluation for acute kidney injury, elevated
transaminases.

EXAM:
ABDOMEN ULTRASOUND COMPLETE

[Series 1: us abdomen complete · 14 of 73 slices shown]
[im 1/73]
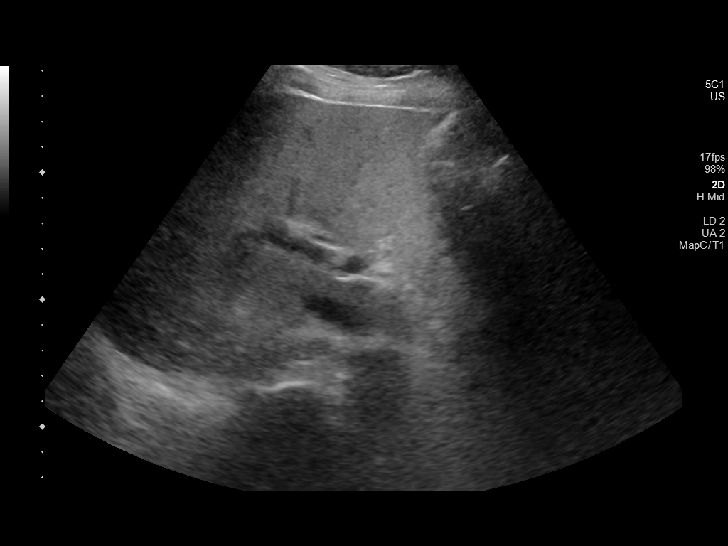
[im 7/73]
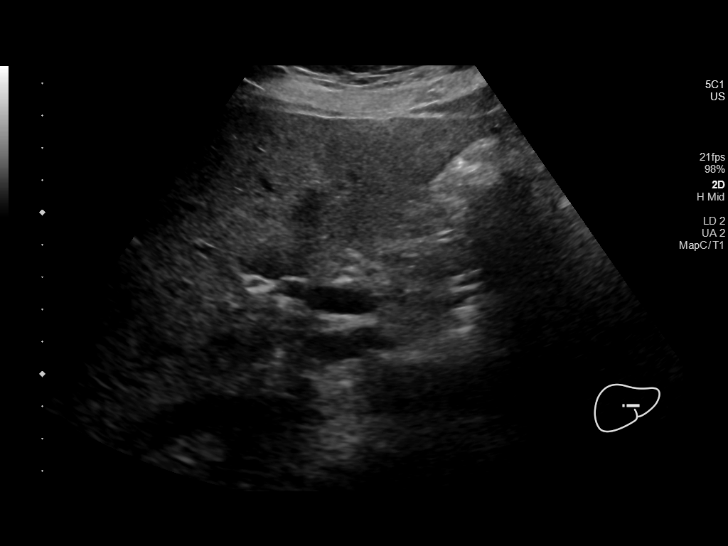
[im 13/73]
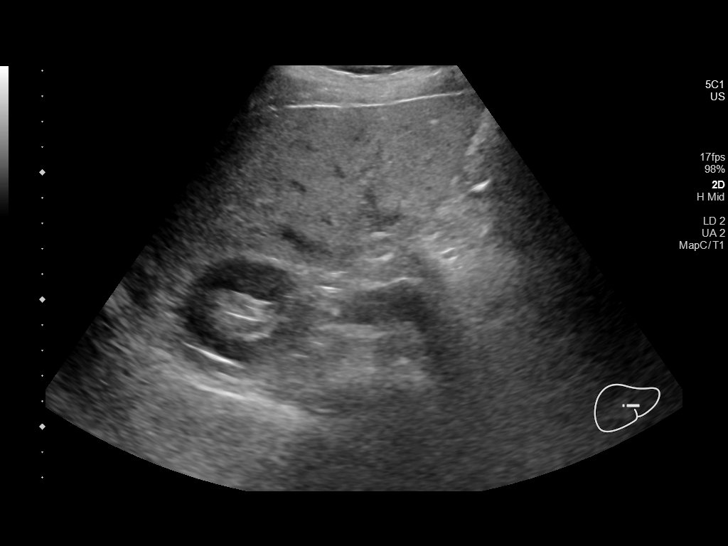
[im 19/73]
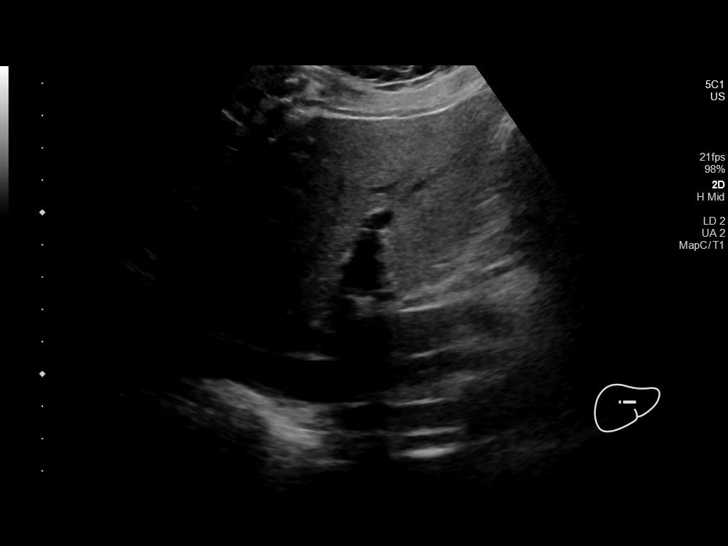
[im 25/73]
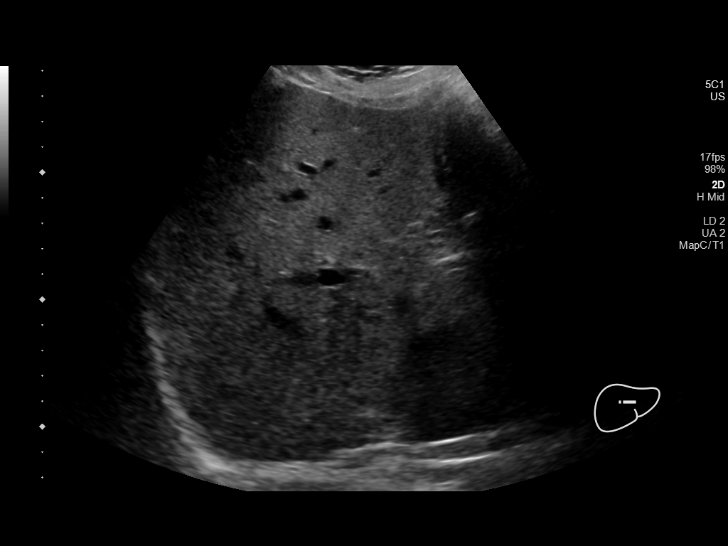
[im 28/73]
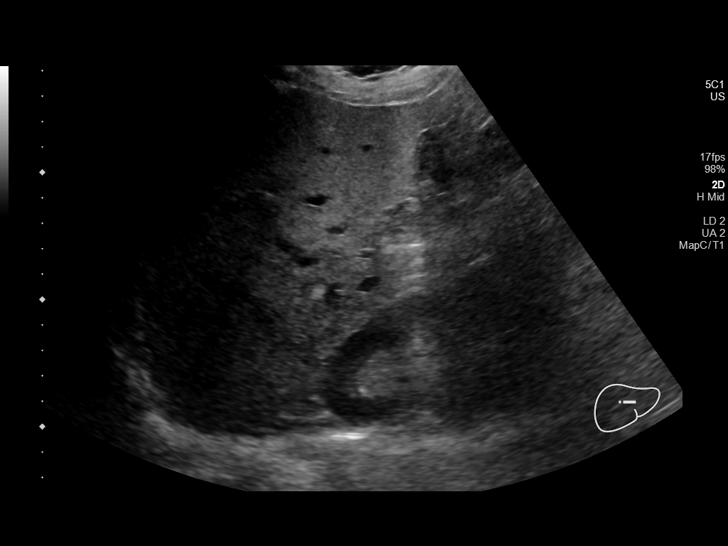
[im 34/73]
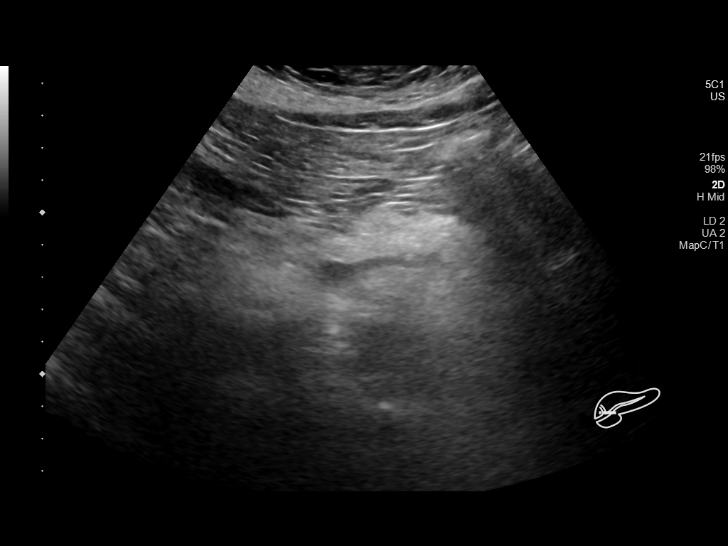
[im 40/73]
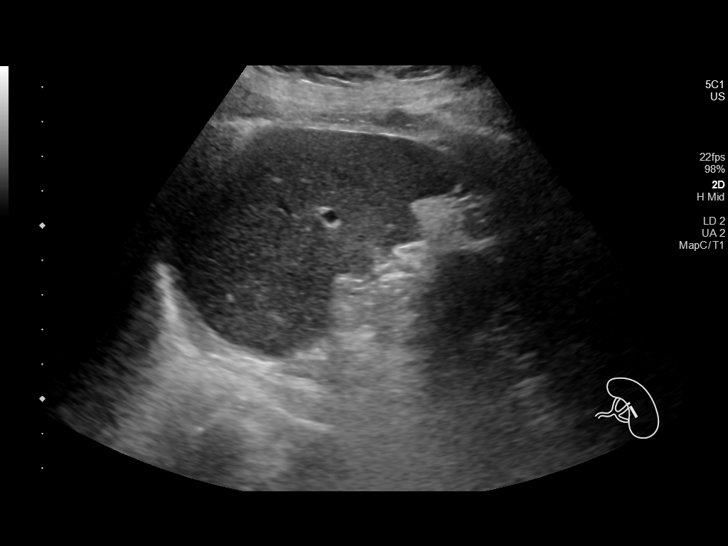
[im 46/73]
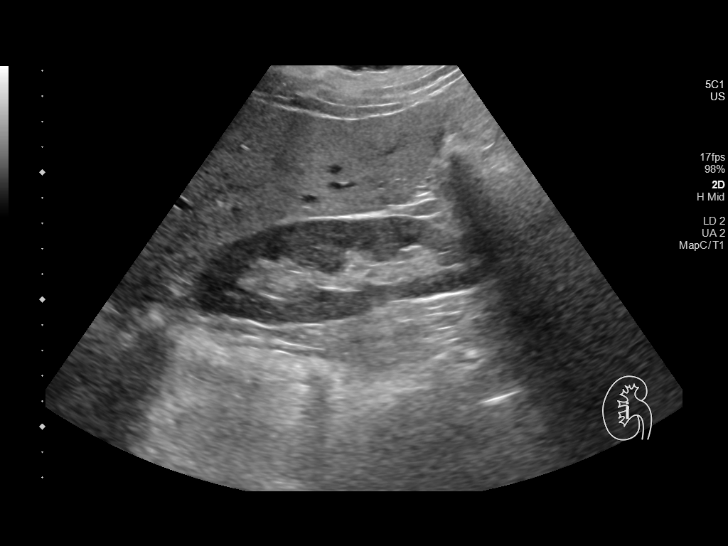
[im 49/73]
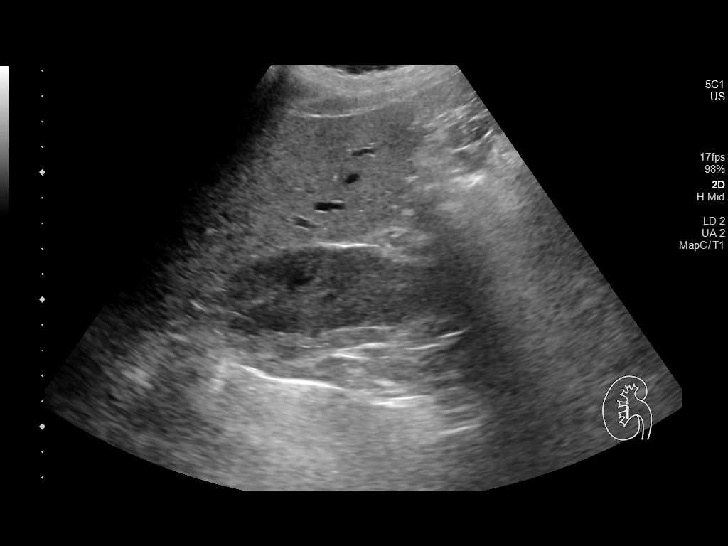
[im 55/73]
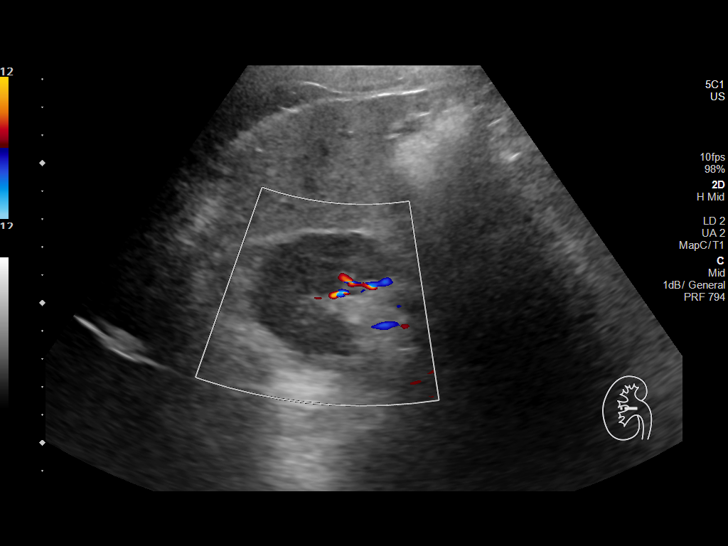
[im 61/73]
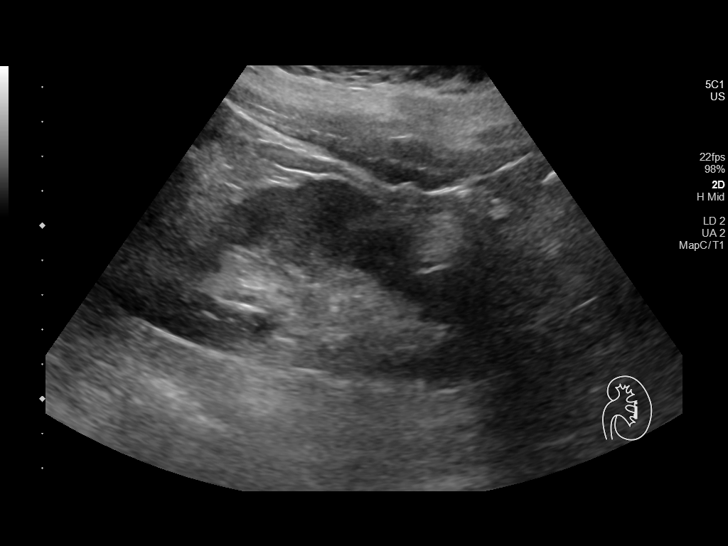
[im 67/73]
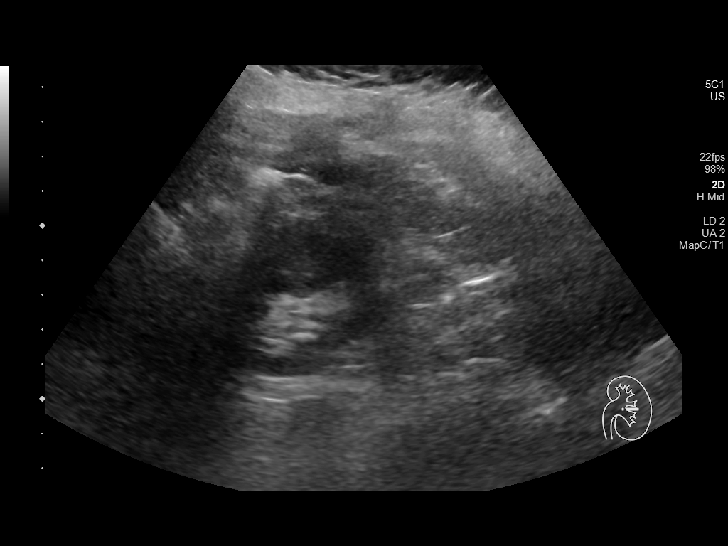
[im 73/73]
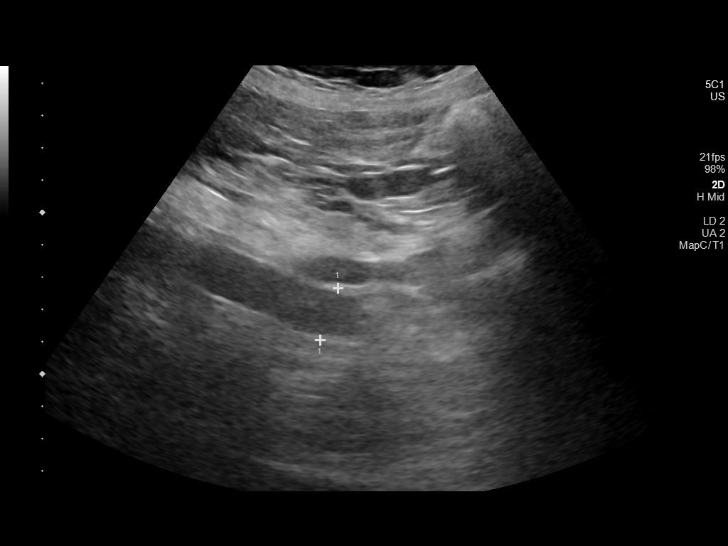

[14 of 25 positions shown; findings below may reference images not displayed]

FINDINGS: Gallbladder: Prior cholecystectomy.

Common bile duct: Diameter: 2.6 mm

Liver: No focal lesion identified. Diffusely increased echogenicity
seen within the hepatic parenchyma. Portal vein is patent on color
Doppler imaging with normal direction of blood flow towards the
liver.

IVC: No abnormality visualized.

Pancreas: Visualized portion unremarkable.

Spleen: Size and appearance within normal limits.

Right Kidney: Length: 12.2 cm. Echogenicity within normal limits. No
mass or hydronephrosis visualized. No nephrolithiasis.

Left Kidney: Length: 11.2 cm. Echogenicity within normal limits. No
mass or hydronephrosis visualized. No nephrolithiasis.

Abdominal aorta: No aneurysm visualized.

Other findings: None.
IMPRESSION: 1. Diffusely increased echogenicity within the hepatic parenchyma,
nonspecific, but most commonly seen with steatosis.
2. Prior cholecystectomy.  No biliary dilatation.
3. Normal sonographic evaluation of the kidneys. No hydronephrosis
or other acute abnormality.

## 2022-07-08 IMAGING — DX DG CHEST 1V PORT
1 series · 1 of 1 positions shown · non-contrast
Comparison: Portable exam 5038 hours compared to 08/20/2014

CLINICAL DATA: Body aches since [REDACTED], history hypertension

EXAM:
PORTABLE CHEST 1 VIEW

[chest ap]
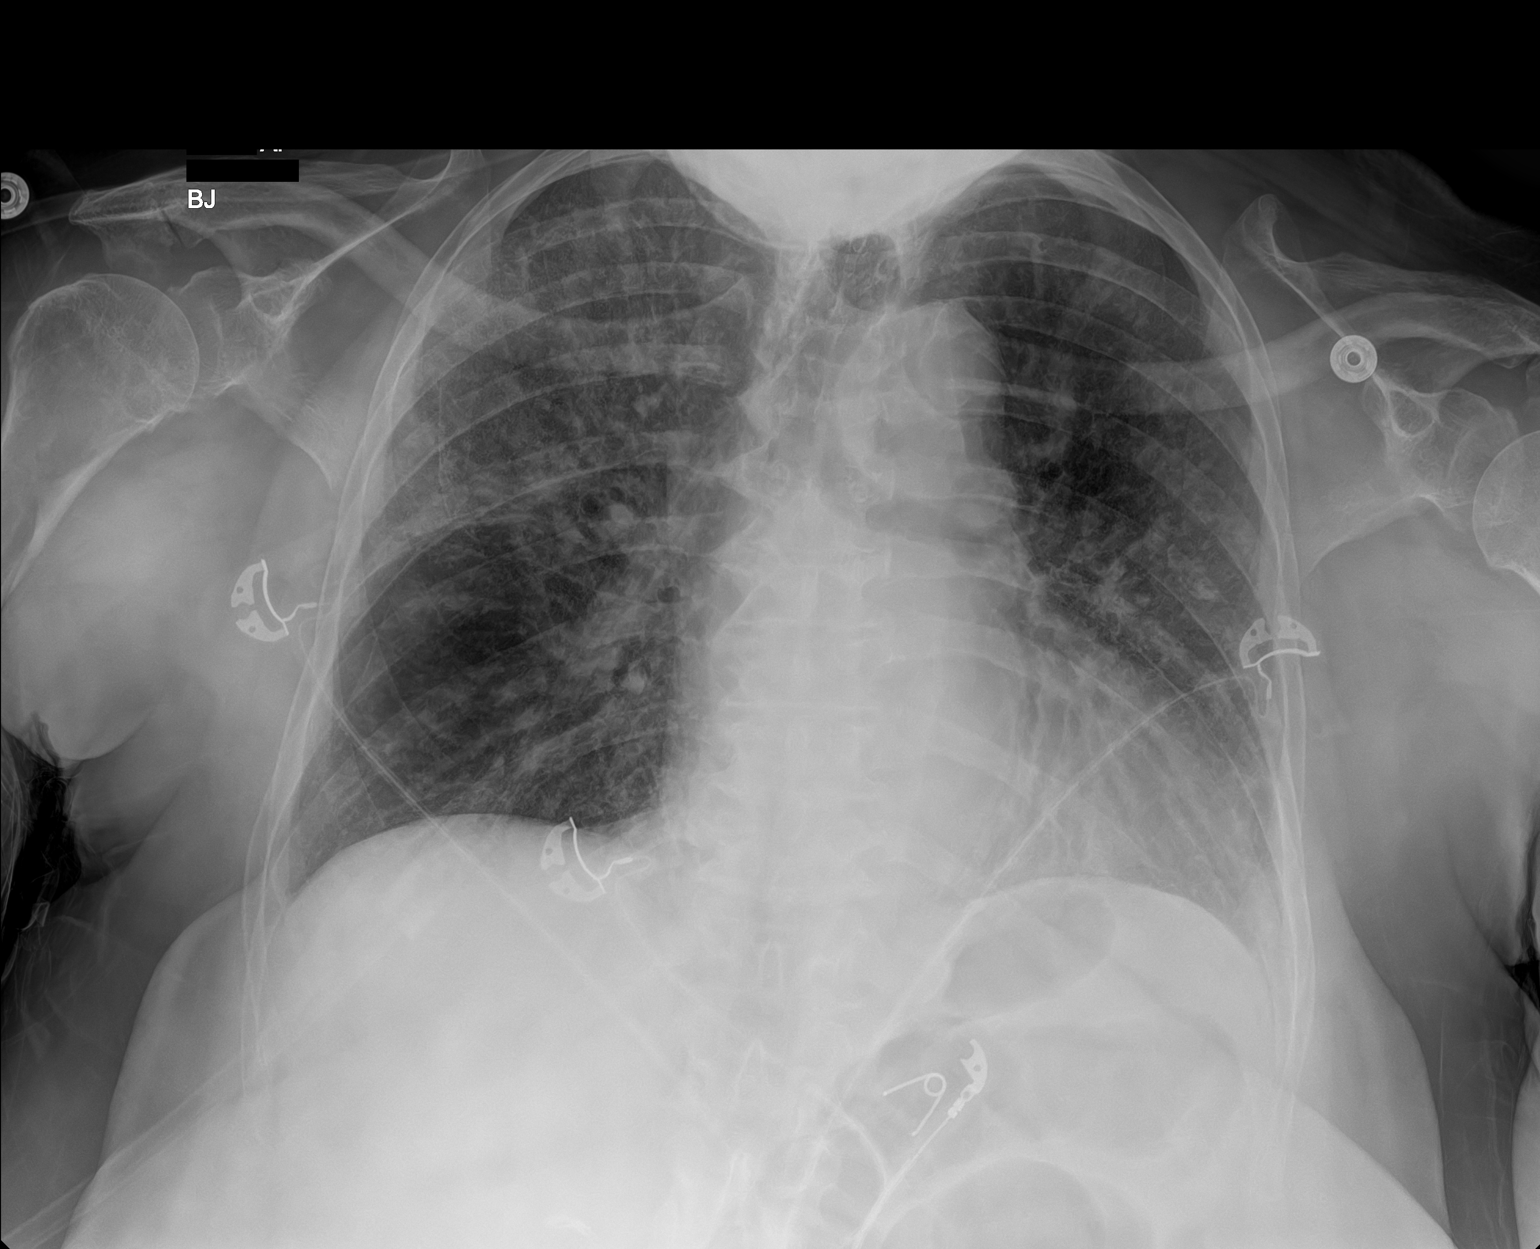

[1 of 1 positions shown; findings below may reference images not displayed]

FINDINGS: Borderline enlargement of cardiac silhouette.

Mediastinal contours normal.

Patchy BILATERAL pulmonary infiltrates, favor multifocal pneumonia.

Minimal streaky atelectasis LEFT base.

No pleural effusion or pneumothorax.

Bones demineralized.
IMPRESSION: Patchy BILATERAL pulmonary infiltrates question multifocal
pneumonia.

## 2022-07-23 IMAGING — DX DG CHEST 1V PORT
1 series · 1 of 1 positions shown · non-contrast
Comparison: 05/20/2020.

CLINICAL DATA: COVID.  Respiratory failure.

EXAM:
PORTABLE CHEST 1 VIEW

[chest ap]
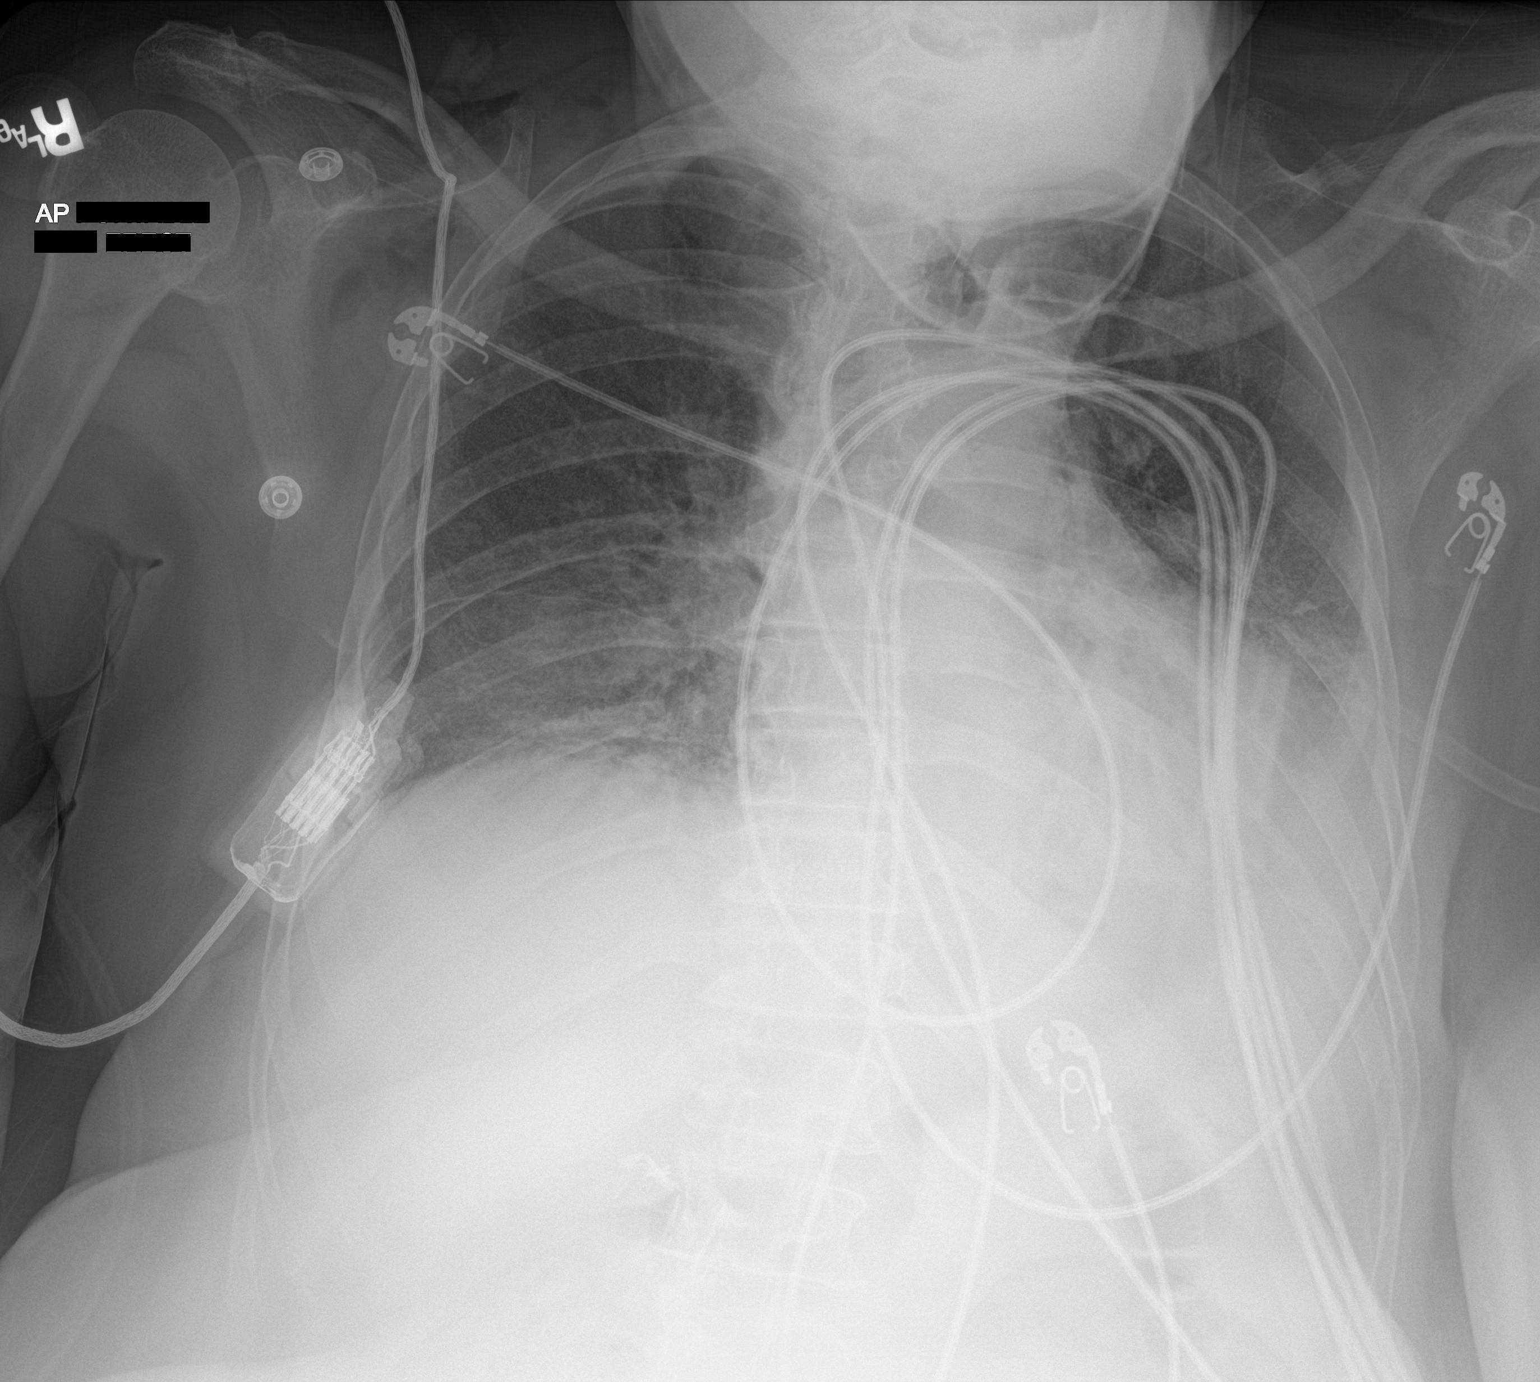

[1 of 1 positions shown; findings below may reference images not displayed]

FINDINGS: Mediastinum hilar structures normal. Heart size stable. Low lung
volumes with basilar atelectasis. Diffuse bilateral interstitial
infiltrates/edema noted. Small left pleural effusion. No
pneumothorax. Surgical clips right upper quadrant. Degenerative
change thoracic spine. Subcutaneous emphysema noted over the right
neck and shoulder.
IMPRESSION: 1. Low lung volumes with basilar atelectasis. Diffuse bilateral
interstitial infiltrates/edema noted. Small left pleural effusion.

2. Subcutaneous emphysema noted over the right neck and shoulder. No
pneumothorax.
